# Patient Record
Sex: Male | Born: 1950
Health system: Southern US, Community
[De-identification: ages and names within clinical notes are randomized; demographics above are authoritative.]

## PROBLEM LIST (undated history)

## (undated) DIAGNOSIS — L0231 Cutaneous abscess of buttock: Secondary | ICD-10-CM

## (undated) DIAGNOSIS — I2699 Other pulmonary embolism without acute cor pulmonale: Secondary | ICD-10-CM

## (undated) DIAGNOSIS — K5790 Diverticulosis of intestine, part unspecified, without perforation or abscess without bleeding: Secondary | ICD-10-CM

## (undated) DIAGNOSIS — L03317 Cellulitis of buttock: Secondary | ICD-10-CM

## (undated) DIAGNOSIS — B958 Unspecified staphylococcus as the cause of diseases classified elsewhere: Secondary | ICD-10-CM

## (undated) DIAGNOSIS — D126 Benign neoplasm of colon, unspecified: Secondary | ICD-10-CM

## (undated) DIAGNOSIS — M1990 Unspecified osteoarthritis, unspecified site: Secondary | ICD-10-CM

## (undated) DIAGNOSIS — I82409 Acute embolism and thrombosis of unspecified deep veins of unspecified lower extremity: Secondary | ICD-10-CM

## (undated) HISTORY — DX: Diverticulosis of intestine, part unspecified, without perforation or abscess without bleeding: K57.90

## (undated) HISTORY — DX: Benign neoplasm of colon, unspecified: D12.6

## (undated) HISTORY — DX: Unspecified osteoarthritis, unspecified site: M19.90

## (undated) HISTORY — DX: Other pulmonary embolism without acute cor pulmonale: I26.99

## (undated) HISTORY — DX: Unspecified staphylococcus as the cause of diseases classified elsewhere: B95.8

## (undated) HISTORY — DX: Acute embolism and thrombosis of unspecified deep veins of unspecified lower extremity: I82.409

## (undated) HISTORY — DX: Cellulitis of buttock: L03.317

## (undated) HISTORY — PX: OTHER SURGICAL HISTORY: SHX169

## (undated) HISTORY — PX: AMPUTATION: SHX166

## (undated) HISTORY — DX: Cutaneous abscess of buttock: L02.31

---

## 1997-12-04 ENCOUNTER — Encounter: Payer: Self-pay | Admitting: Emergency Medicine

## 1997-12-04 ENCOUNTER — Inpatient Hospital Stay (HOSPITAL_COMMUNITY): Admission: EM | Admit: 1997-12-04 | Discharge: 1997-12-20 | Payer: Self-pay | Admitting: Emergency Medicine

## 2002-03-22 ENCOUNTER — Emergency Department (HOSPITAL_COMMUNITY): Admission: EM | Admit: 2002-03-22 | Discharge: 2002-03-22 | Payer: Self-pay | Admitting: Emergency Medicine

## 2012-07-25 ENCOUNTER — Encounter (HOSPITAL_COMMUNITY): Payer: Self-pay | Admitting: *Deleted

## 2012-07-25 ENCOUNTER — Emergency Department (HOSPITAL_COMMUNITY): Payer: Medicare Other

## 2012-07-25 ENCOUNTER — Inpatient Hospital Stay (HOSPITAL_COMMUNITY)
Admission: EM | Admit: 2012-07-25 | Discharge: 2012-07-30 | DRG: 571 | Disposition: A | Discharge status: Home / Self Care | Payer: Medicare Other | Attending: Internal Medicine | Admitting: Internal Medicine

## 2012-07-25 DIAGNOSIS — D72829 Elevated white blood cell count, unspecified: Secondary | ICD-10-CM

## 2012-07-25 DIAGNOSIS — R6510 Systemic inflammatory response syndrome (SIRS) of non-infectious origin without acute organ dysfunction: Secondary | ICD-10-CM

## 2012-07-25 DIAGNOSIS — L03317 Cellulitis of buttock: Principal | ICD-10-CM

## 2012-07-25 DIAGNOSIS — R000 Tachycardia, unspecified: Secondary | ICD-10-CM

## 2012-07-25 DIAGNOSIS — E669 Obesity, unspecified: Secondary | ICD-10-CM | POA: Diagnosis present

## 2012-07-25 DIAGNOSIS — L0231 Cutaneous abscess of buttock: Principal | ICD-10-CM

## 2012-07-25 DIAGNOSIS — R509 Fever, unspecified: Secondary | ICD-10-CM

## 2012-07-25 DIAGNOSIS — L0390 Cellulitis, unspecified: Secondary | ICD-10-CM

## 2012-07-25 LAB — POCT I-STAT, CHEM 8
BUN: 10 mg/dL (ref 6–23)
Creatinine, Ser: 1 mg/dL (ref 0.50–1.35)
Glucose, Bld: 125 mg/dL — ABNORMAL HIGH (ref 70–99)
Potassium: 3.7 mEq/L (ref 3.5–5.1)
Sodium: 137 mEq/L (ref 135–145)

## 2012-07-25 LAB — CBC WITH DIFFERENTIAL/PLATELET
Basophils Absolute: 0 10*3/uL (ref 0.0–0.1)
HCT: 48.9 % (ref 39.0–52.0)
Lymphocytes Relative: 13 % (ref 12–46)
Lymphs Abs: 1.9 10*3/uL (ref 0.7–4.0)
MCV: 94.4 fL (ref 78.0–100.0)
Monocytes Absolute: 1.5 10*3/uL — ABNORMAL HIGH (ref 0.1–1.0)
Neutro Abs: 11 10*3/uL — ABNORMAL HIGH (ref 1.7–7.7)
Platelets: 127 10*3/uL — ABNORMAL LOW (ref 150–400)
RBC: 5.18 MIL/uL (ref 4.22–5.81)
RDW: 12 % (ref 11.5–15.5)
WBC: 14.4 10*3/uL — ABNORMAL HIGH (ref 4.0–10.5)

## 2012-07-25 LAB — BASIC METABOLIC PANEL
CO2: 27 mEq/L (ref 19–32)
Chloride: 97 mEq/L (ref 96–112)
Glucose, Bld: 122 mg/dL — ABNORMAL HIGH (ref 70–99)
Sodium: 134 mEq/L — ABNORMAL LOW (ref 135–145)

## 2012-07-25 MED ORDER — SODIUM CHLORIDE 0.9 % IJ SOLN: 3.0000 mL | INTRAMUSCULAR | Status: DC | PRN

## 2012-07-25 MED ORDER — ACETAMINOPHEN 650 MG RE SUPP: 650.0000 mg | Freq: Four times a day (QID) | RECTAL | Status: DC | PRN

## 2012-07-25 MED ORDER — SODIUM CHLORIDE 0.9 % IV SOLN
2000.0000 mg | INTRAVENOUS | Status: AC
  Administered 2012-07-25: 2000 mg via INTRAVENOUS
  Filled 2012-07-25: qty 2000

## 2012-07-25 MED ORDER — MORPHINE SULFATE 4 MG/ML IJ SOLN
4.0000 mg | Freq: Once | INTRAMUSCULAR | Status: AC
  Administered 2012-07-25: 4 mg via INTRAVENOUS
  Filled 2012-07-25: qty 1

## 2012-07-25 MED ORDER — ONDANSETRON HCL 4 MG/2ML IJ SOLN: 4.0000 mg | Freq: Four times a day (QID) | INTRAMUSCULAR | Status: DC | PRN

## 2012-07-25 MED ORDER — SODIUM CHLORIDE 0.9 % IV SOLN
250.0000 mL | INTRAVENOUS | Status: DC | PRN
  Administered 2012-07-28: 12:00:00 via INTRAVENOUS

## 2012-07-25 MED ORDER — HYDROCODONE-ACETAMINOPHEN 5-325 MG PO TABS
1.0000 | ORAL_TABLET | ORAL | Status: DC | PRN
  Administered 2012-07-25 – 2012-07-30 (×18): 2 via ORAL
  Administered 2012-07-30: 1 via ORAL
  Filled 2012-07-25 (×8): qty 2
  Filled 2012-07-25: qty 1
  Filled 2012-07-25 (×10): qty 2

## 2012-07-25 MED ORDER — ACETAMINOPHEN 325 MG PO TABS: 650.0000 mg | ORAL_TABLET | Freq: Four times a day (QID) | ORAL | Status: DC | PRN

## 2012-07-25 MED ORDER — SODIUM CHLORIDE 0.9 % IJ SOLN
3.0000 mL | Freq: Two times a day (BID) | INTRAMUSCULAR | Status: DC
  Administered 2012-07-26 – 2012-07-30 (×7): 3 mL via INTRAVENOUS

## 2012-07-25 MED ORDER — ACETAMINOPHEN 325 MG PO TABS
650.0000 mg | ORAL_TABLET | Freq: Once | ORAL | Status: AC
  Administered 2012-07-25: 650 mg via ORAL
  Filled 2012-07-25: qty 2

## 2012-07-25 MED ORDER — VANCOMYCIN HCL 10 G IV SOLR
1500.0000 mg | Freq: Two times a day (BID) | INTRAVENOUS | Status: DC
  Administered 2012-07-26 – 2012-07-30 (×9): 1500 mg via INTRAVENOUS
  Filled 2012-07-25 (×12): qty 1500

## 2012-07-25 MED ORDER — IOHEXOL 300 MG/ML  SOLN
100.0000 mL | Freq: Once | INTRAMUSCULAR | Status: AC | PRN
  Administered 2012-07-25: 100 mL via INTRAVENOUS

## 2012-07-25 MED ORDER — ONDANSETRON HCL 4 MG PO TABS: 4.0000 mg | ORAL_TABLET | Freq: Four times a day (QID) | ORAL | Status: DC | PRN

## 2012-07-25 NOTE — ED Notes (Signed)
Pt had a syncopal episode while starting IV. Pt and pt wife stated that he hates needles and that he "passes out with the site of needles"

## 2012-07-25 NOTE — H&P (Signed)
PCP:   No PCP Per Patient   Chief Complaint:  Butt pain  HPI: 62 yo male healthy with 2 days of worsening pain to right buttock.  Wife saw a pinhead lesion in the area and "popped" it last night, has some drainage from it last night.  Today is more red and painful.  No recent antibiotics.  No n/v.  No rectal pain.  No fevers at home, but febrile here over 102.  Eating well.    Review of Systems:  Positive and negative as per HPI otherwise all other systems are negative  Past Medical History: History reviewed. No pertinent past medical history. No past surgical history on file.  Medications: Prior to Admission medications   Medication Sig Start Date End Date Taking? Authorizing Provider  aspirin EC 81 MG tablet Take 81 mg by mouth daily.   Yes Historical Provider, MD  vitamin E 1000 UNIT capsule Take 1,000 Units by mouth daily.   Yes Historical Provider, MD    Allergies:  No Known Allergies  Social History: Neg x 3, wife and daughter are present with him in ED  Family History: none  Physical Exam: Filed Vitals:   07/25/12 1931 07/25/12 2000 07/25/12 2101 07/25/12 2200  BP: 145/70 153/79  104/81  Pulse: 101 104  105  Temp: 100 F (37.8 C)  102.7 F (39.3 C)   TempSrc: Oral  Oral   Resp: 18     SpO2: 97% 95%  97%   General appearance: alert, cooperative and no distress nontoxic Head: Normocephalic, without obvious abnormality, atraumatic Eyes: negative Nose: Nares normal. Septum midline. Mucosa normal. No drainage or sinus tenderness. Neck: no JVD and supple, symmetrical, trachea midline Lungs: clear to auscultation bilaterally Heart: regular rate and rhythm, S1, S2 normal, no murmur, click, rub or gallop Abdomen: soft, non-tender; bowel sounds normal; no masses,  no organomegaly Extremities: extremities normal, atraumatic, no cyanosis or edema Pulses: 2+ and symmetric Skin: large area of erythema to right buttock, no drainage, indurated but no flunctuance.  Area  marked out with marker by nursing staff Neurologic: Grossly normal   Labs on Admission:   Recent Labs  07/25/12 1804 07/25/12 1946  NA 134* 137  K 4.0 3.7  CL 97 98  CO2 27  --   GLUCOSE 122* 125*  BUN 10 10  CREATININE 0.81 1.00  CALCIUM 9.1  --     Recent Labs  07/25/12 1804 07/25/12 1946  WBC 14.4*  --   NEUTROABS 11.0*  --   HGB 17.0 17.3*  HCT 48.9 51.0  MCV 94.4  --   PLT 127*  --    Radiological Exams on Admission: Ct Pelvis W Contrast  07/25/2012   *RADIOLOGY REPORT*  Clinical Data:  Right buttock abscess.  CT PELVIS WITH CONTRAST  Technique:  Multidetector CT imaging of the pelvis was performed using the standard protocol following the bolus administration of intravenous contrast.  Contrast: OMNIPAQUE IOHEXOL 300 MG/ML  SOLN  Comparison:   None.  Findings:  Soft tissue stranding in the posterior subcutaneous fat on the right, inferiorly.  No fluid collections are seen on these images.  No soft tissue gas.  Unremarkable bones.  Bilateral inguinal hernias containing fat.  Horseshoe kidney on the first images.  Unremarkable bowel loops.  Coarse prostatic calcifications.  IMPRESSION:  1.  Right posterior subcutaneous edema with no visible fluid collection on these images.  The inferior extent of the edema is not included. 2.  Bilateral  inguinal hernias containing fat. 3.  Horseshoe kidney.   Original Report Authenticated By: Beckie Salts, M.D.    Assessment/Plan  62 yo male with right buttock cellulitis Principal Problem:   Cellulitis and abscess of buttock Active Problems:   Tachycardia   Systemic inflammatory response syndrome (SIRS)  Iv vancomycin.  Pain meds.  Full code.  Meets sirs criteria.  Should improve in next 24 to 48 hours with iv vancomycin.  Aaron Lynch A 07/25/2012, 10:33 PM

## 2012-07-25 NOTE — ED Provider Notes (Signed)
History    CSN: [id number] Arrival date & time 07/25/12  1405  First MD Initiated Contact with Patient 07/25/12 1737     Chief Complaint  Patient presents with  . Abscess   (Consider location/radiation/quality/duration/timing/severity/associated sxs/prior Treatment) HPI Comments: Patient presents with a chief complaint of an abscess of his right buttock.  He reports that the abscess has been present for the past 2 days and is gradually worsening.  Two days ago his wife popped the abscess and expressed purulent fluid.  Yesterday his wife noticed that the area of skin surrounding the abscess became erythematous.  He was seen by Urgent Care just prior to arrival and sent to the ED for further evaluation.  He states that his temperature was 100.2 F at Urgent Care.  However, upon arrival in the ED his temperature was 98.2 and he was not given any medication for fever.  He has not checked his temperature at home.   He denies any nausea or vomiting.   Denies abdominal pain.  Last BM was earlier this morning, which he reports was normal.  No pain with BM.  He reports that he has been urinating normally.  He denies any history of Abscesses.  Denies history of DM.  Patient is a 62 y.o. male presenting with abscess. The history is provided by the patient.  Abscess  History reviewed. No pertinent past medical history. No past surgical history on file. No family history on file. History  Substance Use Topics  . Smoking status: Not on file  . Smokeless tobacco: Not on file  . Alcohol Use: Not on file    Review of Systems  All other systems reviewed and are negative.    Allergies  Review of patient's allergies indicates no known allergies.  Home Medications   Current Outpatient Rx  Name  Route  Sig  Dispense  Refill  . aspirin EC 81 MG tablet   Oral   Take 81 mg by mouth daily.         . vitamin E 1000 UNIT capsule   Oral   Take 1,000 Units by mouth daily.          BP 167/80   Pulse 88  Temp(Src) 99.3 F (37.4 C) (Oral)  Resp 16  SpO2 98% Physical Exam  Nursing note and vitals reviewed. Constitutional: He appears well-developed and well-nourished.  HENT:  Head: Normocephalic and atraumatic.  Neck: Normal range of motion. Neck supple.  Cardiovascular: Normal rate, regular rhythm and normal heart sounds.   Pulmonary/Chest: Effort normal and breath sounds normal.  Abdominal: Soft. Bowel sounds are normal. He exhibits no distension and no mass. There is no tenderness. There is no rebound and no guarding.  Neurological: He is alert.  Skin: Skin is warm and dry.     Psychiatric: He has a normal mood and affect.    ED Course  Procedures (including critical care time) Labs Reviewed - No data to display Ct Pelvis W Contrast  07/25/2012   *RADIOLOGY REPORT*  Clinical Data:  Right buttock abscess.  CT PELVIS WITH CONTRAST  Technique:  Multidetector CT imaging of the pelvis was performed using the standard protocol following the bolus administration of intravenous contrast.  Contrast: OMNIPAQUE IOHEXOL 300 MG/ML  SOLN  Comparison:   None.  Findings:  Soft tissue stranding in the posterior subcutaneous fat on the right, inferiorly.  No fluid collections are seen on these images.  No soft tissue gas.  Unremarkable bones.  Bilateral inguinal hernias containing fat.  Horseshoe kidney on the first images.  Unremarkable bowel loops.  Coarse prostatic calcifications.  IMPRESSION:  1.  Right posterior subcutaneous edema with no visible fluid collection on these images.  The inferior extent of the edema is not included. 2.  Bilateral inguinal hernias containing fat. 3.  Horseshoe kidney.   Original Report Authenticated By: Beckie Salts, M.D.   No diagnosis found.  Patient discussed with and also evaluated by Dr. Manus Gunning.  Discussed with Dr. Onalee Hua with [facility] who has agreed to admit the patient.    MDM  Patient presenting with cellulitis of the right buttock.   No fluctuance palpated.  Therefore, incision and drainage was not performed.  He was afebrile upon arrival in the ED, however, his temperature rose to 102.7 during ED course.  Labs show an elevated WBC of 14.4.  Normal lactate.   Patient started on IV Vancomycin while in the ED.  CT of the pelvis does not show any visible fluid collection.  Patient admitted to [facility] for additional management.  Pascal Lux [place], PA-C 07/26/12 1609

## 2012-07-25 NOTE — Progress Notes (Signed)
ANTIBIOTIC CONSULT NOTE - INITIAL  Pharmacy Consult for vancomycin  Indication: cellulitis/abscess  No Known Allergies  Patient Measurements:   Adjusted Body Weight:   Vital Signs: Temp: 100 F (37.8 C) (07/11 1931) Temp src: Oral (07/11 1931) BP: 145/70 mmHg (07/11 1931) Pulse Rate: 101 (07/11 1931) Intake/Output from previous day:   Intake/Output from this shift:    Labs:  Recent Labs  07/25/12 1946  HGB 17.3*  CREATININE 1.00   CrCl is unknown because there is no height on file for the current visit. No results found for this basename: VANCOTROUGH, VANCOPEAK, VANCORANDOM, GENTTROUGH, GENTPEAK, GENTRANDOM, TOBRATROUGH, TOBRAPEAK, TOBRARND, AMIKACINPEAK, AMIKACINTROU, AMIKACIN,  in the last 72 hours   Microbiology: No results found for this or any previous visit (from the past 720 hour(s)).  Medical History: History reviewed. No pertinent past medical history.  Medications:  Anti-infectives   Start     Dose/Rate Route Frequency Ordered Stop   07/26/12 0900  vancomycin (VANCOCIN) 1,500 mg in sodium chloride 0.9 % 500 mL IVPB     1,500 mg 250 mL/hr over 120 Minutes Intravenous Every 12 hours 07/25/12 1959     07/25/12 1830  vancomycin (VANCOCIN) 2,000 mg in sodium chloride 0.9 % 500 mL IVPB     2,000 mg 250 mL/hr over 120 Minutes Intravenous To Emergency Dept 07/25/12 1811 07/26/12 1830     Assessment: 62 yom presented to the ED c/o abscess since Wednesday. To start empiric vancomycin for cellulitis/abscess. Tmax is 100 and WBC is 14.4, Scr is WNL at 1. Dosing will be based on patients stated height/weight of 74in/318lbs.   Goal of Therapy:  Vancomycin trough level 10-15 mcg/ml  Plan:  1. Vancomycin 2gm IV x 1 then 1500mg  IV Q12H 2. F/u renal fxn, C&S, clinical status and trough at [facility], [name] 07/25/2012,7:59 PM

## 2012-07-25 NOTE — ED Notes (Addendum)
Pt in c/o abscess to right buttock since Wednesday, some drainage, pt states he went to urgent care and he was told the abscess was too close to his rectal area for them to I and D.

## 2012-07-25 NOTE — ED Notes (Signed)
Patient transported to CT 

## 2012-07-26 DIAGNOSIS — L0291 Cutaneous abscess, unspecified: Secondary | ICD-10-CM

## 2012-07-26 DIAGNOSIS — D72829 Elevated white blood cell count, unspecified: Secondary | ICD-10-CM

## 2012-07-26 LAB — BASIC METABOLIC PANEL
Calcium: 8.5 mg/dL (ref 8.4–10.5)
GFR calc non Af Amer: 88 mL/min — ABNORMAL LOW (ref >90)
Sodium: 133 mEq/L — ABNORMAL LOW (ref 135–145)

## 2012-07-26 LAB — CBC
MCH: 33.1 pg (ref 26.0–34.0)
RBC: 4.9 MIL/uL (ref 4.22–5.81)
WBC: 13.9 10*3/uL — ABNORMAL HIGH (ref 4.0–10.5)

## 2012-07-26 NOTE — ED Provider Notes (Signed)
Medical screening examination/treatment/procedure(s) were conducted as a shared visit with non-physician practitioner(s) and myself.  I personally evaluated the patient during the encounter  Pain, redness, swelling to R buttock x 2 days.  Associated with fever.   Large area of cellulitis with central induration. No fluid collection seen on imaging.  Glynn Octave, MD 07/26/12 [phone]

## 2012-07-26 NOTE — Progress Notes (Signed)
UR Completed.  Aaron Lynch [phone] 07/26/2012  

## 2012-07-26 NOTE — Progress Notes (Signed)
[facility] PROGRESS NOTE  Aaron Lynch ZOX:[id number] DOB: [birth year] DOA: 07/25/2012 PCP: No PCP Per Patient  Assessment/Plan: 1. Cellulitis of buttock- warm compresses, culture wound, IV abx- wound area is marked 2. Obesity 3. Leukocytosis- decreasing  Code Status: full Family Communication: patient/wife Disposition Plan: abx   Consultants:  none  Procedures:    Antibiotics:  vanc  HPI/Subjective: Buttocks still hurting  Objective: Filed Vitals:   07/25/12 2300 07/25/12 2311 07/25/12 2344 07/26/12 0545  BP: 114/52  117/47 95/47  Pulse: 96  94 91  Temp:  100.6 F (38.1 C) 101 F (38.3 C) 100.7 F (38.2 C)  TempSrc:  Oral Oral Oral  Resp:   16 17  Height:   6\' 2"  (1.88 m)   Weight:   142.475 kg (314 lb 1.6 oz)   SpO2: 96%  96% 93%    Intake/Output Summary (Last 24 hours) at 07/26/12 1230 Last data filed at 07/25/12 2243  Gross per 24 hour  Intake      0 ml  Output    500 ml  Net   -500 ml   Filed Weights   07/25/12 2244 07/25/12 2344  Weight: 144.244 kg (318 lb) 142.475 kg (314 lb 1.6 oz)    Exam:   General:  A+Ox3, NAD  Cardiovascular: rrr  Respiratory: clear anterior  Abdomen: +BS, soft  Musculoskeletal: moves all 4 ext   Data Reviewed: Basic Metabolic Panel:  Recent Labs Lab 07/25/12 1804 07/25/12 1946 07/26/12 0410  NA 134* 137 133*  K 4.0 3.7 4.0  CL 97 98 96  CO2 27  --  30  GLUCOSE 122* 125* 133*  BUN 10 10 10   CREATININE 0.81 1.00 0.93  CALCIUM 9.1  --  8.5   Liver Function Tests: No results found for this basename: AST, ALT, ALKPHOS, BILITOT, PROT, ALBUMIN,  in the last 168 hours No results found for this basename: LIPASE, AMYLASE,  in the last 168 hours No results found for this basename: AMMONIA,  in the last 168 hours CBC:  Recent Labs Lab 07/25/12 1804 07/25/12 1946 07/26/12 0410  WBC 14.4*  --  13.9*  NEUTROABS 11.0*  --   --   HGB 17.0 17.3* 16.2  HCT 48.9 51.0 46.6  MCV 94.4  --  95.1  PLT  127*  --  120*   Cardiac Enzymes: No results found for this basename: CKTOTAL, CKMB, CKMBINDEX, TROPONINI,  in the last 168 hours BNP (last 3 results) No results found for this basename: PROBNP,  in the last 8760 hours CBG: No results found for this basename: GLUCAP,  in the last 168 hours  No results found for this or any previous visit (from the past 240 hour(s)).   Studies: Ct Pelvis W Contrast  07/25/2012   *RADIOLOGY REPORT*  Clinical Data:  Right buttock abscess.  CT PELVIS WITH CONTRAST  Technique:  Multidetector CT imaging of the pelvis was performed using the standard protocol following the bolus administration of intravenous contrast.  Contrast: OMNIPAQUE IOHEXOL 300 MG/ML  SOLN  Comparison:   None.  Findings:  Soft tissue stranding in the posterior subcutaneous fat on the right, inferiorly.  No fluid collections are seen on these images.  No soft tissue gas.  Unremarkable bones.  Bilateral inguinal hernias containing fat.  Horseshoe kidney on the first images.  Unremarkable bowel loops.  Coarse prostatic calcifications.  IMPRESSION:  1.  Right posterior subcutaneous edema with no visible fluid collection on these images.  The inferior extent of the edema is not included. 2.  Bilateral inguinal hernias containing fat. 3.  Horseshoe kidney.   Original Report Authenticated By: Beckie Salts, M.D.    Scheduled Meds: . sodium chloride  3 mL Intravenous Q12H  . vancomycin  1,500 mg Intravenous Q12H   Continuous Infusions:   Principal Problem:   Cellulitis and abscess of buttock Active Problems:   Tachycardia   Systemic inflammatory response syndrome (SIRS)    Time spent: 35    [facility], Karandeep Resende  [facility] Pager [phone]. If 7PM-7AM, please contact night-coverage at [email or web address], password [facility] 07/26/2012, 12:30 PM  LOS: 1 day

## 2012-07-27 DIAGNOSIS — L0231 Cutaneous abscess of buttock: Secondary | ICD-10-CM

## 2012-07-27 DIAGNOSIS — L03317 Cellulitis of buttock: Secondary | ICD-10-CM

## 2012-07-27 LAB — BASIC METABOLIC PANEL
CO2: 25 mEq/L (ref 19–32)
Calcium: 8.6 mg/dL (ref 8.4–10.5)
Creatinine, Ser: 0.91 mg/dL (ref 0.50–1.35)
GFR calc non Af Amer: 89 mL/min — ABNORMAL LOW (ref >90)
Glucose, Bld: 111 mg/dL — ABNORMAL HIGH (ref 70–99)
Sodium: 134 mEq/L — ABNORMAL LOW (ref 135–145)

## 2012-07-27 LAB — CBC
MCH: 32.9 pg (ref 26.0–34.0)
MCHC: 34.3 g/dL (ref 30.0–36.0)
MCV: 95.7 fL (ref 78.0–100.0)
Platelets: 126 10*3/uL — ABNORMAL LOW (ref 150–400)
RBC: 4.84 MIL/uL (ref 4.22–5.81)

## 2012-07-27 MED ORDER — DEXTROSE-NACL 5-0.9 % IV SOLN
INTRAVENOUS | Status: DC
  Administered 2012-07-27: 100 mL/h via INTRAVENOUS
  Administered 2012-07-28: 09:00:00 via INTRAVENOUS

## 2012-07-27 NOTE — Consult Note (Signed)
Reason for Consult:Abscesses to right gluteal region Referring Physician: Dr. Vann  Aaron Lynch is an 62 y.o. male.  HPI: the patient is a 60-year-old male who is admitted to the hospital approximately 2 days ago for a right gluteal salines. The patient was placed on antibiotics. The area to the patient's gluteus was become more erythematous-looking be an area of fluctuance. Surgical consult obtained.  History reviewed. No pertinent past medical history.  No past surgical history on file.  No family history on file.  Social History:  has no tobacco, alcohol, and drug history on file.  Allergies: No Known Allergies  Medications: I have reviewed the patient's current medications.  Results for orders placed during the hospital encounter of 07/25/12 (from the past 48 hour(s))  CBC WITH DIFFERENTIAL     Status: Abnormal   Collection Time    07/25/12  6:04 PM      Result Value Range   WBC 14.4 (*) 4.0 - 10.5 K/uL   RBC 5.18  4.22 - 5.81 MIL/uL   Hemoglobin 17.0  13.0 - 17.0 g/dL   HCT 48.9  39.0 - 52.0 %   MCV 94.4  78.0 - 100.0 fL   MCH 32.8  26.0 - 34.0 pg   MCHC 34.8  30.0 - 36.0 g/dL   RDW 12.0  11.5 - 15.5 %   Platelets 127 (*) 150 - 400 K/uL   Neutrophils Relative % 76  43 - 77 %   Neutro Abs 11.0 (*) 1.7 - 7.7 K/uL   Lymphocytes Relative 13  12 - 46 %   Lymphs Abs 1.9  0.7 - 4.0 K/uL   Monocytes Relative 10  3 - 12 %   Monocytes Absolute 1.5 (*) 0.1 - 1.0 K/uL   Eosinophils Relative 0  0 - 5 %   Eosinophils Absolute 0.1  0.0 - 0.7 K/uL   Basophils Relative 0  0 - 1 %   Basophils Absolute 0.0  0.0 - 0.1 K/uL  BASIC METABOLIC PANEL     Status: Abnormal   Collection Time    07/25/12  6:04 PM      Result Value Range   Sodium 134 (*) 135 - 145 mEq/L   Potassium 4.0  3.5 - 5.1 mEq/L   Chloride 97  96 - 112 mEq/L   CO2 27  19 - 32 mEq/L   Glucose, Bld 122 (*) 70 - 99 mg/dL   BUN 10  6 - 23 mg/dL   Creatinine, Ser 0.81  0.50 - 1.35 mg/dL   Calcium 9.1  8.4 - 10.5  mg/dL   GFR calc non Af Amer >90  >90 mL/min   GFR calc Af Amer >90  >90 mL/min   Comment:            The eGFR has been calculated     using the CKD EPI equation.     This calculation has not been     validated in all clinical     situations.     eGFR's persistently     <90 mL/min signify     possible Chronic Kidney Disease.  POCT I-STAT, CHEM 8     Status: Abnormal   Collection Time    07/25/12  7:46 PM      Result Value Range   Sodium 137  135 - 145 mEq/L   Potassium 3.7  3.5 - 5.1 mEq/L   Chloride 98  96 - 112 mEq/L   BUN 10    6 - 23 mg/dL   Creatinine, Ser 1.00  0.50 - 1.35 mg/dL   Glucose, Bld 125 (*) 70 - 99 mg/dL   Calcium, Ion 1.13  1.13 - 1.30 mmol/L   TCO2 26  0 - 100 mmol/L   Hemoglobin 17.3 (*) 13.0 - 17.0 g/dL   HCT 51.0  39.0 - 52.0 %  CG4 I-STAT (LACTIC ACID)     Status: None   Collection Time    07/25/12 10:52 PM      Result Value Range   Lactic Acid, Venous 1.30  0.5 - 2.2 mmol/L  BASIC METABOLIC PANEL     Status: Abnormal   Collection Time    07/26/12  4:10 AM      Result Value Range   Sodium 133 (*) 135 - 145 mEq/L   Potassium 4.0  3.5 - 5.1 mEq/L   Chloride 96  96 - 112 mEq/L   CO2 30  19 - 32 mEq/L   Glucose, Bld 133 (*) 70 - 99 mg/dL   BUN 10  6 - 23 mg/dL   Creatinine, Ser 0.93  0.50 - 1.35 mg/dL   Calcium 8.5  8.4 - 10.5 mg/dL   GFR calc non Af Amer 88 (*) >90 mL/min   GFR calc Af Amer >90  >90 mL/min   Comment:            The eGFR has been calculated     using the CKD EPI equation.     This calculation has not been     validated in all clinical     situations.     eGFR's persistently     <90 mL/min signify     possible Chronic Kidney Disease.  CBC     Status: Abnormal   Collection Time    07/26/12  4:10 AM      Result Value Range   WBC 13.9 (*) 4.0 - 10.5 K/uL   RBC 4.90  4.22 - 5.81 MIL/uL   Hemoglobin 16.2  13.0 - 17.0 g/dL   HCT 46.6  39.0 - 52.0 %   MCV 95.1  78.0 - 100.0 fL   MCH 33.1  26.0 - 34.0 pg   MCHC 34.8  30.0 - 36.0  g/dL   RDW 12.1  11.5 - 15.5 %   Platelets 120 (*) 150 - 400 K/uL  CULTURE, ROUTINE-ABSCESS     Status: None   Collection Time    07/26/12 12:30 PM      Result Value Range   Specimen Description ABSCESS     Special Requests RIGHT BUTTOCKS     Gram Stain       Value: RARE WBC PRESENT, PREDOMINANTLY PMN     NO SQUAMOUS EPITHELIAL CELLS SEEN     FEW GRAM POSITIVE COCCI IN PAIRS   Culture Culture reincubated for better growth     Report Status PENDING    CBC     Status: Abnormal   Collection Time    07/27/12  5:00 AM      Result Value Range   WBC 12.7 (*) 4.0 - 10.5 K/uL   RBC 4.84  4.22 - 5.81 MIL/uL   Hemoglobin 15.9  13.0 - 17.0 g/dL   HCT 46.3  39.0 - 52.0 %   MCV 95.7  78.0 - 100.0 fL   MCH 32.9  26.0 - 34.0 pg   MCHC 34.3  30.0 - 36.0 g/dL   RDW 12.1  11.5 - 15.5 %     Platelets 126 (*) 150 - 400 K/uL  BASIC METABOLIC PANEL     Status: Abnormal   Collection Time    07/27/12  5:00 AM      Result Value Range   Sodium 134 (*) 135 - 145 mEq/L   Potassium 3.7  3.5 - 5.1 mEq/L   Chloride 98  96 - 112 mEq/L   CO2 25  19 - 32 mEq/L   Glucose, Bld 111 (*) 70 - 99 mg/dL   BUN 10  6 - 23 mg/dL   Creatinine, Ser 0.91  0.50 - 1.35 mg/dL   Calcium 8.6  8.4 - 10.5 mg/dL   GFR calc non Af Amer 89 (*) >90 mL/min   GFR calc Af Amer >90  >90 mL/min   Comment:            The eGFR has been calculated     using the CKD EPI equation.     This calculation has not been     validated in all clinical     situations.     eGFR's persistently     <90 mL/min signify     possible Chronic Kidney Disease.    Ct Pelvis W Contrast  07/25/2012   *RADIOLOGY REPORT*  Clinical Data:  Right buttock abscess.  CT PELVIS WITH CONTRAST  Technique:  Multidetector CT imaging of the pelvis was performed using the standard protocol following the bolus administration of intravenous contrast.  Contrast: 100mL OMNIPAQUE IOHEXOL 300 MG/ML  SOLN  Comparison:   None.  Findings:  Soft tissue stranding in the posterior  subcutaneous fat on the right, inferiorly.  No fluid collections are seen on these images.  No soft tissue gas.  Unremarkable bones.  Bilateral inguinal hernias containing fat.  Horseshoe kidney on the first images.  Unremarkable bowel loops.  Coarse prostatic calcifications.  IMPRESSION:  1.  Right posterior subcutaneous edema with no visible fluid collection on these images.  The inferior extent of the edema is not included. 2.  Bilateral inguinal hernias containing fat. 3.  Horseshoe kidney.   Original Report Authenticated By: Steven Reid, M.D.    Review of Systems  Unable to perform ROS Constitutional: Negative.   HENT: Negative.   Eyes: Negative.   Respiratory: Negative.   Cardiovascular: Negative.   Gastrointestinal: Negative.   Genitourinary: Negative.   Musculoskeletal: Negative.   Skin: Negative.   Neurological: Negative.    Blood pressure 126/64, pulse 90, temperature 98.6 F (37 C), temperature source Oral, resp. rate 18, height 6' 2" (1.88 m), weight 314 lb 1.6 oz (142.475 kg), SpO2 96.00%. Physical Exam  Constitutional: He is oriented to person, place, and time. He appears well-developed and well-nourished.  HENT:  Head: Normocephalic and atraumatic.  Eyes: Conjunctivae and EOM are normal. Pupils are equal, round, and reactive to light.  Neck: Normal range of motion. Neck supple.  Cardiovascular: Normal rate, regular rhythm and normal heart sounds.   Respiratory: Effort normal.  GI: Bowel sounds are normal.  Genitourinary:     Neurological: He is alert and oriented to person, place, and time.  Skin: Skin is warm and dry.    Assessment/Plan: 62-year-old male with a right gluteal abscess. 1. Will make patient n.p.o. At this time and the patient consented for a I&D of her right gluteal abscess. 2. We'll schedule patient for a I&D of gluteal abscess in the a.m.   Kaien Pezzullo Jr., Diamantina Edinger 07/27/2012, 5:38 PM      

## 2012-07-27 NOTE — Progress Notes (Signed)
[facility] PROGRESS NOTE  Aaron Lynch ZOX:[id number] DOB: [birth year] DOA: 07/25/2012 PCP: No PCP Per Patient  Assessment/Plan: 1. Cellulitis of buttock- warm compresses,  Wound culture pending, IV abx- wound area is marked- has gone outside of area, firm area palpated- CT scan done but does not appear to go down far enough; consulted surgery 2. Obesity 3. Leukocytosis- decreasing  Code Status: full Family Communication: patient/wife Disposition Plan: abx   Consultants:  none  Procedures:    Antibiotics:  vanc  HPI/Subjective: Up getting shower Pain worsened around butocks  Objective: Filed Vitals:   07/26/12 1452 07/26/12 1548 07/26/12 2322 07/27/12 0537  BP: 107/55 108/56 96/47 103/47  Pulse: 78 82 73 82  Temp: 100.2 F (37.9 C) 98.9 F (37.2 C) 99.6 F (37.6 C) 99.4 F (37.4 C)  TempSrc: Oral Oral Oral Oral  Resp: 18 18 17 18   Height:      Weight:      SpO2: 95% 96% 92% 95%    Intake/Output Summary (Last 24 hours) at 07/27/12 1014 Last data filed at 07/27/12 0200  Gross per 24 hour  Intake   1240 ml  Output      0 ml  Net   1240 ml   Filed Weights   07/25/12 2244 07/25/12 2344  Weight: 144.244 kg (318 lb) 142.475 kg (314 lb 1.6 oz)    Exam:   General:  A+Ox3, NAD  Cardiovascular: rrr  Respiratory: clear anterior  Abdomen: +BS, soft  Musculoskeletal: moves all 4 ext   Data Reviewed: Basic Metabolic Panel:  Recent Labs Lab 07/25/12 1804 07/25/12 1946 07/26/12 0410 07/27/12 0500  NA 134* 137 133* 134*  K 4.0 3.7 4.0 3.7  CL 97 98 96 98  CO2 27  --  30 25  GLUCOSE 122* 125* 133* 111*  BUN 10 10 10 10   CREATININE 0.81 1.00 0.93 0.91  CALCIUM 9.1  --  8.5 8.6   Liver Function Tests: No results found for this basename: AST, ALT, ALKPHOS, BILITOT, PROT, ALBUMIN,  in the last 168 hours No results found for this basename: LIPASE, AMYLASE,  in the last 168 hours No results found for this basename: AMMONIA,  in the last 168  hours CBC:  Recent Labs Lab 07/25/12 1804 07/25/12 1946 07/26/12 0410 07/27/12 0500  WBC 14.4*  --  13.9* 12.7*  NEUTROABS 11.0*  --   --   --   HGB 17.0 17.3* 16.2 15.9  HCT 48.9 51.0 46.6 46.3  MCV 94.4  --  95.1 95.7  PLT 127*  --  120* 126*   Cardiac Enzymes: No results found for this basename: CKTOTAL, CKMB, CKMBINDEX, TROPONINI,  in the last 168 hours BNP (last 3 results) No results found for this basename: PROBNP,  in the last 8760 hours CBG: No results found for this basename: GLUCAP,  in the last 168 hours  Recent Results (from the past 240 hour(s))  CULTURE, ROUTINE-ABSCESS     Status: None   Collection Time    07/26/12 12:30 PM      Result Value Range Status   Specimen Description ABSCESS   Final   Special Requests RIGHT BUTTOCKS   Final   Gram Stain PENDING   Incomplete   Culture Culture reincubated for better growth   Final   Report Status PENDING   Incomplete     Studies: Ct Pelvis W Contrast  07/25/2012   *RADIOLOGY REPORT*  Clinical Data:  Right buttock abscess.  CT PELVIS  WITH CONTRAST  Technique:  Multidetector CT imaging of the pelvis was performed using the standard protocol following the bolus administration of intravenous contrast.  Contrast: OMNIPAQUE IOHEXOL 300 MG/ML  SOLN  Comparison:   None.  Findings:  Soft tissue stranding in the posterior subcutaneous fat on the right, inferiorly.  No fluid collections are seen on these images.  No soft tissue gas.  Unremarkable bones.  Bilateral inguinal hernias containing fat.  Horseshoe kidney on the first images.  Unremarkable bowel loops.  Coarse prostatic calcifications.  IMPRESSION:  1.  Right posterior subcutaneous edema with no visible fluid collection on these images.  The inferior extent of the edema is not included. 2.  Bilateral inguinal hernias containing fat. 3.  Horseshoe kidney.   Original Report Authenticated By: Beckie Salts, M.D.    Scheduled Meds: . sodium chloride  3 mL Intravenous Q12H   . vancomycin  1,500 mg Intravenous Q12H   Continuous Infusions:   Principal Problem:   Cellulitis and abscess of buttock Active Problems:   Tachycardia   Systemic inflammatory response syndrome (SIRS)    Time spent: 25    Marlin Canary  [facility] Pager [phone]. If 7PM-7AM, please contact night-coverage at [email or web address], password [facility] 07/27/2012, 10:14 AM  LOS: 2 days

## 2012-07-28 ENCOUNTER — Encounter (HOSPITAL_COMMUNITY)
Admission: EM | Disposition: A | Discharge status: Home / Self Care | Payer: Self-pay | Source: Home / Self Care | Attending: Internal Medicine

## 2012-07-28 ENCOUNTER — Inpatient Hospital Stay (HOSPITAL_COMMUNITY): Payer: Medicare Other | Admitting: Anesthesiology

## 2012-07-28 ENCOUNTER — Encounter (HOSPITAL_COMMUNITY): Payer: Self-pay | Admitting: Anesthesiology

## 2012-07-28 ENCOUNTER — Encounter (HOSPITAL_COMMUNITY): Payer: Self-pay | Admitting: *Deleted

## 2012-07-28 DIAGNOSIS — R509 Fever, unspecified: Secondary | ICD-10-CM

## 2012-07-28 HISTORY — PX: INCISION AND DRAINAGE PERIRECTAL ABSCESS: SHX1804

## 2012-07-28 LAB — SURGICAL PCR SCREEN
MRSA, PCR: NEGATIVE
Staphylococcus aureus: NEGATIVE

## 2012-07-28 SURGERY — INCISION AND DRAINAGE, ABSCESS, PERIRECTAL
Anesthesia: General | Site: Buttocks | Wound class: Dirty or Infected

## 2012-07-28 MED ORDER — MORPHINE SULFATE 4 MG/ML IJ SOLN: 4.0000 mg | INTRAMUSCULAR | Status: DC | PRN

## 2012-07-28 MED ORDER — LIDOCAINE HCL (CARDIAC) 20 MG/ML IV SOLN
INTRAVENOUS | Status: DC | PRN
  Administered 2012-07-28: 100 mg via INTRAVENOUS

## 2012-07-28 MED ORDER — ONDANSETRON HCL 4 MG/2ML IJ SOLN: 4.0000 mg | Freq: Once | INTRAMUSCULAR | Status: AC | PRN

## 2012-07-28 MED ORDER — PROPOFOL 10 MG/ML IV BOLUS
INTRAVENOUS | Status: DC | PRN
  Administered 2012-07-28: 300 mg via INTRAVENOUS

## 2012-07-28 MED ORDER — FENTANYL CITRATE 0.05 MG/ML IJ SOLN
INTRAMUSCULAR | Status: DC | PRN
  Administered 2012-07-28: 50 ug via INTRAVENOUS
  Administered 2012-07-28 (×2): 100 ug via INTRAVENOUS

## 2012-07-28 MED ORDER — LACTATED RINGERS IV SOLN
INTRAVENOUS | Status: DC | PRN
  Administered 2012-07-28: 12:00:00 via INTRAVENOUS

## 2012-07-28 MED ORDER — HYDROMORPHONE HCL PF 1 MG/ML IJ SOLN
0.2500 mg | INTRAMUSCULAR | Status: DC | PRN
  Administered 2012-07-28 (×2): 0.5 mg via INTRAVENOUS

## 2012-07-28 MED ORDER — HYDROMORPHONE HCL PF 1 MG/ML IJ SOLN
INTRAMUSCULAR | Status: AC
  Administered 2012-07-28: 0.5 mg via INTRAVENOUS
  Filled 2012-07-28: qty 1

## 2012-07-28 MED ORDER — ONDANSETRON HCL 4 MG/2ML IJ SOLN
INTRAMUSCULAR | Status: DC | PRN
  Administered 2012-07-28: 4 mg via INTRAVENOUS

## 2012-07-28 MED ORDER — 0.9 % SODIUM CHLORIDE (POUR BTL) OPTIME
TOPICAL | Status: DC | PRN
  Administered 2012-07-28: 1000 mL

## 2012-07-28 SURGICAL SUPPLY — 35 items
BANDAGE GAUZE ELAST BULKY 4 IN (GAUZE/BANDAGES/DRESSINGS) ×2 IMPLANT
BLADE SURG 10 STRL SS (BLADE) ×2 IMPLANT
BLADE SURG 15 STRL LF DISP TIS (BLADE) ×2 IMPLANT
BLADE SURG 15 STRL SS (BLADE) ×2
CANISTER SUCTION 2500CC (MISCELLANEOUS) ×2 IMPLANT
CLEANER TIP ELECTROSURG 2X2 (MISCELLANEOUS) IMPLANT
CLOTH BEACON ORANGE TIMEOUT ST (SAFETY) ×2 IMPLANT
COVER SURGICAL LIGHT HANDLE (MISCELLANEOUS) ×2 IMPLANT
DRAPE UTILITY 15X26 W/TAPE STR (DRAPE) ×4 IMPLANT
DRSG PAD ABDOMINAL 8X10 ST (GAUZE/BANDAGES/DRESSINGS) ×2 IMPLANT
ELECT CAUTERY BLADE 6.4 (BLADE) ×2 IMPLANT
ELECT REM PT RETURN 9FT ADLT (ELECTROSURGICAL)
ELECTRODE REM PT RTRN 9FT ADLT (ELECTROSURGICAL) IMPLANT
GAUZE PACKING IODOFORM 1 (PACKING) IMPLANT
GAUZE SPONGE 4X4 16PLY XRAY LF (GAUZE/BANDAGES/DRESSINGS) ×2 IMPLANT
GLOVE BIO SURGEON STRL SZ7.5 (GLOVE) ×4 IMPLANT
GLOVE BIOGEL PI IND STRL 7.5 (GLOVE) ×2 IMPLANT
GLOVE BIOGEL PI INDICATOR 7.5 (GLOVE) ×2
GOWN STRL NON-REIN LRG LVL3 (GOWN DISPOSABLE) ×6 IMPLANT
KIT BASIN OR (CUSTOM PROCEDURE TRAY) ×2 IMPLANT
KIT ROOM TURNOVER OR (KITS) ×2 IMPLANT
NS IRRIG 1000ML POUR BTL (IV SOLUTION) ×2 IMPLANT
PACK LITHOTOMY IV (CUSTOM PROCEDURE TRAY) ×2 IMPLANT
PAD ARMBOARD 7.5X6 YLW CONV (MISCELLANEOUS) ×4 IMPLANT
PENCIL BUTTON HOLSTER BLD 10FT (ELECTRODE) IMPLANT
SPONGE GAUZE 4X4 12PLY (GAUZE/BANDAGES/DRESSINGS) ×2 IMPLANT
SWAB COLLECTION DEVICE MRSA (MISCELLANEOUS) ×2 IMPLANT
TAPE CLOTH SURG 6X10 WHT LF (GAUZE/BANDAGES/DRESSINGS) ×2 IMPLANT
TOWEL OR 17X24 6PK STRL BLUE (TOWEL DISPOSABLE) ×2 IMPLANT
TOWEL OR 17X26 10 PK STRL BLUE (TOWEL DISPOSABLE) ×2 IMPLANT
TUBE ANAEROBIC SPECIMEN COL (MISCELLANEOUS) ×2 IMPLANT
TUBE CONNECTING 12X1/4 (SUCTIONS) ×2 IMPLANT
UNDERPAD 30X30 INCONTINENT (UNDERPADS AND DIAPERS) ×2 IMPLANT
WATER STERILE IRR 1000ML POUR (IV SOLUTION) ×2 IMPLANT
YANKAUER SUCT BULB TIP NO VENT (SUCTIONS) ×2 IMPLANT

## 2012-07-28 NOTE — Progress Notes (Signed)
ANTIBIOTIC CONSULT NOTE - FOLLOW UP  Pharmacy Consult for Vancomycin Indication: cellulitis, buttock abscess  No Known Allergies  Patient Measurements: Height: 6\' 2"  (188 cm) Weight: 314 lb 1.6 oz (142.475 kg) IBW/kg (Calculated) : 82.2  Vital Signs: Temp: 98.7 F (37.1 C) (07/14 1240) Temp src: Oral (07/14 1105) BP: 130/77 mmHg (07/14 1330) Pulse Rate: 91 (07/14 1330) Intake/Output from previous day: 07/13 0701 - 07/14 0700 In: 560 [P.O.:560] Out: -  Intake/Output from this shift: Total I/O In: 800 [I.V.:800] Out: -   Labs:  Recent Labs  07/25/12 1804 07/25/12 1946 07/26/12 0410 07/27/12 0500  WBC 14.4*  --  13.9* 12.7*  HGB 17.0 17.3* 16.2 15.9  PLT 127*  --  120* 126*  CREATININE 0.81 1.00 0.93 0.91   Estimated Creatinine Clearance: 126.5 ml/min (by C-G formula based on Cr of 0.91).  Recent Labs  07/28/12 0750  VANCOTROUGH 10.7     Microbiology: Recent Results (from the past 720 hour(s))  CULTURE, ROUTINE-ABSCESS     Status: None   Collection Time    07/26/12 12:30 PM      Result Value Range Status   Specimen Description ABSCESS   Final   Special Requests RIGHT BUTTOCKS   Final   Gram Stain     Final   Value: RARE WBC PRESENT, PREDOMINANTLY PMN     NO SQUAMOUS EPITHELIAL CELLS SEEN     FEW GRAM POSITIVE COCCI IN PAIRS   Culture     Final   Value: FEW STAPHYLOCOCCUS AUREUS     Note: RIFAMPIN AND GENTAMICIN SHOULD NOT BE USED AS SINGLE DRUGS FOR TREATMENT OF STAPH INFECTIONS.   Report Status PENDING   Incomplete  SURGICAL PCR SCREEN     Status: None   Collection Time    07/28/12  5:12 AM      Result Value Range Status   MRSA, PCR NEGATIVE  NEGATIVE Final   Staphylococcus aureus NEGATIVE  NEGATIVE Final   Comment:            The Xpert SA Assay (FDA     approved for NASAL specimens     in patients over 96 years of age),     is one component of     a comprehensive surveillance     program.  Test performance has     been validated by [employer] for patients greater     than or equal to 52 year old.     It is not intended     to diagnose infection nor to     guide or monitor treatment.    Anti-infectives   Start     Dose/Rate Route Frequency Ordered Stop   07/26/12 0900  vancomycin (VANCOCIN) 1,500 mg in sodium chloride 0.9 % 500 mL IVPB     1,500 mg 250 mL/hr over 120 Minutes Intravenous Every 12 hours 07/25/12 1959     07/25/12 1830  vancomycin (VANCOCIN) 2,000 mg in sodium chloride 0.9 % 500 mL IVPB     2,000 mg 250 mL/hr over 120 Minutes Intravenous To Emergency Dept 07/25/12 1811 07/25/12 2310      Assessment: 62 yo M admitted 07/25/12 with worsening R buttock pain 2/2 abscess/cellulitis.  Pt was taken to the OR today for surgical I&D of abscess.  Abscess cultures were sent as well.  Pt is on day # 4 IV antibiotics.  Vancomycin trough this AM was therapeutic at 10.7.  Goal of Therapy:  Vancomycin trough level 10-15 mcg/ml  Plan:  Continue Vancomycin 1500 mg IV q12h. Follow-up cx data, renal function, and clinical progress.  [employer], Pharm.D., BCPS Clinical Pharmacist Pager [phone] 07/28/2012 2:17 PM

## 2012-07-28 NOTE — Transfer of Care (Signed)
Immediate Anesthesia Transfer of Care Note  Patient: Aaron Lynch  Procedure(s) Performed: Procedure(s): IRRIGATION AND DEBRIDEMENT GLUTEAL ABCESS (N/A)  Patient Location: PACU  Anesthesia Type:General  Level of Consciousness: awake, alert , oriented and patient cooperative  Airway & Oxygen Therapy: Patient Spontanous Breathing and Patient connected to nasal cannula oxygen  Post-op Assessment: Report given to PACU RN and Post -op Vital signs reviewed and stable  Post vital signs: Reviewed and stable  Complications: No apparent anesthesia complications

## 2012-07-28 NOTE — Progress Notes (Signed)
Patient ambulated with wife, all the way around the unit 2 times this afternoon after surgery. Patient not complaining of pain during ambulation.

## 2012-07-28 NOTE — Preoperative (Signed)
Beta Blockers   Reason not to administer Beta Blockers:Not Applicable 

## 2012-07-28 NOTE — Interval H&P Note (Signed)
History and Physical Interval Note:  07/28/2012 10:55 AM  Aaron Lynch  has presented today for surgery, with the diagnosis of Gluteal Abscess  The various methods of treatment have been discussed with the patient and family. After consideration of risks, benefits and other options for treatment, the patient has consented to  Procedure(s): IRRIGATION AND DEBRIDEMENT Gluteal Abscess (N/A) as a surgical intervention .  The patient's history has been reviewed, patient examined, no change in status, stable for surgery.  I have reviewed the patient's chart and labs.  Questions were answered to the patient's satisfaction.     TOTH III,PAUL S

## 2012-07-28 NOTE — Op Note (Signed)
07/25/2012 - 07/28/2012  12:28 PM  PATIENT:  Aaron Lynch  62 y.o. male  PRE-OPERATIVE DIAGNOSIS:  Gluteal Abscess  POST-OPERATIVE DIAGNOSIS:  Gluteal Abscess  PROCEDURE:  Procedure(s): IRRIGATION AND DEBRIDEMENT GLUTEAL ABCESS (N/A)  SURGEON:  Surgeon(s) and Role:    * Robyne Askew, MD - Primary  PHYSICIAN ASSISTANT:   ASSISTANTS: none   ANESTHESIA:   general  EBL:     BLOOD ADMINISTERED:none  DRAINS: none   LOCAL MEDICATIONS USED:  NONE  SPECIMEN:  No Specimen  DISPOSITION OF SPECIMEN:  N/A  COUNTS:  YES  TOURNIQUET:  * No tourniquets in log *  DICTATION: .Dragon Dictation After informed consent was obtained the patient was brought to the operating room and placed in the supine position on the operating room table. After adequate induction of general anesthesia the patient was moved in the lithotomy position and all pressure points are padded. The perirectal area was prepped with Betadine and draped in usual sterile manner. The patient had a large fluctuant area in the right buttock and perirectal area. An incision was made into the abscess cavity with a 15 blade knife. Cultures were obtained. The abscess cavity was then probed bluntly with a finger to break up all the loculations. The incision was then extended superiorly and inferiorly to make sure the entire cavity was opened and well drained. A large amount of purulent material was evacuated. Hemostasis was then achieved using the Bovie electrocautery. The wound was then packed with moistened Kerlix gauze. Sterile dressings were applied. The patient tolerated the procedure well. At the end of the case all needle sponge and instrument counts were correct. The patient was then awakened and taken to recovery in stable condition.  PLAN OF CARE: Admit to inpatient   PATIENT DISPOSITION:  PACU - hemodynamically stable.   Delay start of Pharmacological VTE agent (>24hrs) due to surgical blood loss or risk of bleeding:  yes

## 2012-07-28 NOTE — Anesthesia Procedure Notes (Signed)
Procedure Name: LMA Insertion Date/Time: 07/28/2012 11:58 AM Performed by: Romie Minus K Pre-anesthesia Checklist: Patient identified, Emergency Drugs available, Suction available, Patient being monitored and Timeout performed Patient Re-evaluated:Patient Re-evaluated prior to inductionOxygen Delivery Method: Circle system utilized Preoxygenation: Pre-oxygenation with 100% oxygen Intubation Type: IV induction Ventilation: Mask ventilation without difficulty and Oral airway inserted - appropriate to patient size LMA: LMA with gastric port inserted LMA Size: 5.0 Number of attempts: 1 Placement Confirmation: positive ETCO2,  CO2 detector and breath sounds checked- equal and bilateral Tube secured with: Tape Dental Injury: Teeth and Oropharynx as per pre-operative assessment

## 2012-07-28 NOTE — Plan of Care (Signed)
Problem: Phase I Progression Outcomes Goal: Incision/dressings dry and intact Outcome: Progressing Dry is soiled with serosanguineous drainage.  Dressing change ordered for 0500hrs 07/29/12.  Patient is having decreased pain to this area.  Will continue to monitor patient condition.

## 2012-07-28 NOTE — Anesthesia Preprocedure Evaluation (Addendum)
Anesthesia Evaluation  Patient identified by MRN, date of birth, ID band Patient awake    Reviewed: Allergy & Precautions, H&P , NPO status , Patient's Chart, lab work & pertinent test results  History of Anesthesia Complications Negative for: history of anesthetic complications  Airway Mallampati: II TM Distance: >3 FB Neck ROM: Full    Dental  (+) Edentulous Upper, Edentulous Lower and Dental Advisory Given   Pulmonary neg pulmonary ROS,          Cardiovascular     Neuro/Psych negative neurological ROS     GI/Hepatic negative GI ROS, Neg liver ROS,   Endo/Other  Morbid obesity  Renal/GU negative Renal ROS     Musculoskeletal   Abdominal   Peds  Hematology negative hematology ROS (+)   Anesthesia Other Findings   Reproductive/Obstetrics negative OB ROS                          Anesthesia Physical Anesthesia Plan  ASA: I  Anesthesia Plan: General   Post-op Pain Management:    Induction: Intravenous  Airway Management Planned: LMA and Oral ETT  Additional Equipment:   Intra-op Plan:   Post-operative Plan: Extubation in OR  Informed Consent: I have reviewed the patients History and Physical, chart, labs and discussed the procedure including the risks, benefits and alternatives for the proposed anesthesia with the patient or authorized representative who has indicated his/her understanding and acceptance.     Plan Discussed with: CRNA, Anesthesiologist and Surgeon  Anesthesia Plan Comments:         Anesthesia Quick Evaluation

## 2012-07-28 NOTE — Progress Notes (Signed)
[facility] PROGRESS NOTE  Aaron Lynch ZOX:[id number] DOB: 23-Oct-1950 DOA: 07/25/2012 PCP: No PCP Per Patient  Assessment/Plan: 1. Cellulitis of buttock- warm compresses,  Wound culture pending, IV abx- wound area is marked- has gone outside of area, firm area palpated- CT scan done but does not appear to go down far enough; consulted surgery for I&D on 7/14 2. Obesity 3. Leukocytosis- decreasing 4. Fever- resolving  Code Status: full Family Communication: patient/wife Disposition Plan: abx   Consultants:  none  Procedures:    Antibiotics:  vanc  HPI/Subjective: Pain continues in area of buttocks  Objective: Filed Vitals:   07/27/12 0537 07/27/12 1517 07/28/12 0021 07/28/12 0612  BP: 103/47 126/64 120/64 99/58  Pulse: 82 90 78 73  Temp: 99.4 F (37.4 C) 98.6 F (37 C) 99.9 F (37.7 C) 98.4 F (36.9 C)  TempSrc: Oral Oral Oral Oral  Resp: 18 18 18 18   Height:      Weight:      SpO2: 95% 96% 93% 95%    Intake/Output Summary (Last 24 hours) at 07/28/12 1007 Last data filed at 07/27/12 1200  Gross per 24 hour  Intake    240 ml  Output      0 ml  Net    240 ml   Filed Weights   07/25/12 2244 07/25/12 2344  Weight: 144.244 kg (318 lb) 142.475 kg (314 lb 1.6 oz)    Exam:   General:  A+Ox3, NAD  Cardiovascular: rrr  Respiratory: clear anterior  Abdomen: +BS, soft  Musculoskeletal: moves all 4 ext   Data Reviewed: Basic Metabolic Panel:  Recent Labs Lab 07/25/12 1804 07/25/12 1946 07/26/12 0410 07/27/12 0500  NA 134* 137 133* 134*  K 4.0 3.7 4.0 3.7  CL 97 98 96 98  CO2 27  --  30 25  GLUCOSE 122* 125* 133* 111*  BUN 10 10 10 10   CREATININE 0.81 1.00 0.93 0.91  CALCIUM 9.1  --  8.5 8.6   Liver Function Tests: No results found for this basename: AST, ALT, ALKPHOS, BILITOT, PROT, ALBUMIN,  in the last 168 hours No results found for this basename: LIPASE, AMYLASE,  in the last 168 hours No results found for this basename:  AMMONIA,  in the last 168 hours CBC:  Recent Labs Lab 07/25/12 1804 07/25/12 1946 07/26/12 0410 07/27/12 0500  WBC 14.4*  --  13.9* 12.7*  NEUTROABS 11.0*  --   --   --   HGB 17.0 17.3* 16.2 15.9  HCT 48.9 51.0 46.6 46.3  MCV 94.4  --  95.1 95.7  PLT 127*  --  120* 126*   Cardiac Enzymes: No results found for this basename: CKTOTAL, CKMB, CKMBINDEX, TROPONINI,  in the last 168 hours BNP (last 3 results) No results found for this basename: PROBNP,  in the last 8760 hours CBG: No results found for this basename: GLUCAP,  in the last 168 hours  Recent Results (from the past 240 hour(s))  CULTURE, ROUTINE-ABSCESS     Status: None   Collection Time    07/26/12 12:30 PM      Result Value Range Status   Specimen Description ABSCESS   Final   Special Requests RIGHT BUTTOCKS   Final   Gram Stain     Final   Value: RARE WBC PRESENT, PREDOMINANTLY PMN     NO SQUAMOUS EPITHELIAL CELLS SEEN     FEW GRAM POSITIVE COCCI IN PAIRS   Culture     Final  Value: FEW STAPHYLOCOCCUS AUREUS     Note: RIFAMPIN AND GENTAMICIN SHOULD NOT BE USED AS SINGLE DRUGS FOR TREATMENT OF STAPH INFECTIONS.   Report Status PENDING   Incomplete  SURGICAL PCR SCREEN     Status: None   Collection Time    07/28/12  5:12 AM      Result Value Range Status   MRSA, PCR NEGATIVE  NEGATIVE Final   Staphylococcus aureus NEGATIVE  NEGATIVE Final   Comment:            The Xpert SA Assay (FDA     approved for NASAL specimens     in patients over 64 years of age),     is one component of     a comprehensive surveillance     program.  Test performance has     been validated by [employer] for patients greater     than or equal to 78 year old.     It is not intended     to diagnose infection nor to     guide or monitor treatment.     Studies: No results found.  Scheduled Meds: . sodium chloride  3 mL Intravenous Q12H  . vancomycin  1,500 mg Intravenous Q12H   Continuous Infusions: . dextrose 5 % and  0.9% NaCl 100 mL/hr at 07/28/12 1610    Principal Problem:   Cellulitis and abscess of buttock Active Problems:   Tachycardia   Systemic inflammatory response syndrome (SIRS)    Time spent: 25    Marlin Canary  [facility] Pager [phone]. If 7PM-7AM, please contact night-coverage at [email or web address], password [facility] 07/28/2012, 10:07 AM  LOS: 3 days

## 2012-07-28 NOTE — Anesthesia Postprocedure Evaluation (Signed)
Anesthesia Post Note  Patient: Aaron Lynch  Procedure(s) Performed: Procedure(s) (LRB): IRRIGATION AND DEBRIDEMENT GLUTEAL ABCESS (N/A)  Anesthesia type: general  Patient location: PACU  Post pain: Pain level controlled  Post assessment: Patient's Cardiovascular Status Stable  Last Vitals:  Filed Vitals:   07/28/12 1245  BP:   Pulse: 102  Temp:   Resp: 17    Post vital signs: Reviewed and stable  Level of consciousness: sedated  Complications: No apparent anesthesia complications

## 2012-07-28 NOTE — H&P (View-Only) (Signed)
Reason for Consult:Abscesses to right gluteal region Referring Physician: Dr. Lonia Mad is an 62 y.o. Lynch.  HPI: the patient is a 62 year old Lynch who is admitted to the hospital approximately 2 days ago for a right gluteal salines. The patient was placed on antibiotics. The area to the patient's gluteus was become more erythematous-looking be an area of fluctuance. Surgical consult obtained.  History reviewed. No pertinent past medical history.  No past surgical history on file.  No family history on file.  Social History:  has no tobacco, alcohol, and drug history on file.  Allergies: No Known Allergies  Medications: I have reviewed the patient's current medications.  Results for orders placed during the hospital encounter of 07/25/12 (from the past 48 hour(s))  CBC WITH DIFFERENTIAL     Status: Abnormal   Collection Time    07/25/12  6:04 PM      Result Value Range   WBC 14.4 (*) 4.0 - 10.5 K/uL   RBC 5.18  4.22 - 5.81 MIL/uL   Hemoglobin 17.0  13.0 - 17.0 g/dL   HCT 11.9  14.7 - 82.9 %   MCV 94.4  78.0 - 100.0 fL   MCH 32.8  26.0 - 34.0 pg   MCHC 34.8  30.0 - Aaron.0 g/dL   RDW 56.2  13.0 - 86.5 %   Platelets 127 (*) 150 - 400 K/uL   Neutrophils Relative % 76  43 - 77 %   Neutro Abs 11.0 (*) 1.7 - 7.7 K/uL   Lymphocytes Relative 13  12 - 46 %   Lymphs Abs 1.9  0.7 - 4.0 K/uL   Monocytes Relative 10  3 - 12 %   Monocytes Absolute 1.5 (*) 0.1 - 1.0 K/uL   Eosinophils Relative 0  0 - 5 %   Eosinophils Absolute 0.1  0.0 - 0.7 K/uL   Basophils Relative 0  0 - 1 %   Basophils Absolute 0.0  0.0 - 0.1 K/uL  BASIC METABOLIC PANEL     Status: Abnormal   Collection Time    07/25/12  6:04 PM      Result Value Range   Sodium 134 (*) 135 - 145 mEq/L   Potassium 4.0  3.5 - 5.1 mEq/L   Chloride 97  96 - 112 mEq/L   CO2 27  19 - 32 mEq/L   Glucose, Bld 122 (*) 70 - 99 mg/dL   BUN 10  6 - 23 mg/dL   Creatinine, Ser 7.84  0.50 - 1.35 mg/dL   Calcium 9.1  8.4 - 69.6  mg/dL   GFR calc non Af Amer >90  >90 mL/min   GFR calc Af Amer >90  >90 mL/min   Comment:            The eGFR has been calculated     using the CKD EPI equation.     This calculation has not been     validated in all clinical     situations.     eGFR's persistently     <90 mL/min signify     possible Chronic Kidney Disease.  POCT I-STAT, CHEM 8     Status: Abnormal   Collection Time    07/25/12  7:46 PM      Result Value Range   Sodium 137  135 - 145 mEq/L   Potassium 3.7  3.5 - 5.1 mEq/L   Chloride 98  96 - 112 mEq/L   BUN 10  6 - 23 mg/dL   Creatinine, Ser 1.61  0.50 - 1.35 mg/dL   Glucose, Bld 096 (*) 70 - 99 mg/dL   Calcium, Ion 0.45  4.09 - 1.30 mmol/L   TCO2 26  0 - 100 mmol/L   Hemoglobin 17.3 (*) 13.0 - 17.0 g/dL   HCT 81.1  91.4 - 78.2 %  CG4 I-STAT (LACTIC ACID)     Status: None   Collection Time    07/25/12 10:52 PM      Result Value Range   Lactic Acid, Venous 1.30  0.5 - 2.2 mmol/L  BASIC METABOLIC PANEL     Status: Abnormal   Collection Time    07/26/12  4:10 AM      Result Value Range   Sodium 133 (*) 135 - 145 mEq/L   Potassium 4.0  3.5 - 5.1 mEq/L   Chloride 96  96 - 112 mEq/L   CO2 30  19 - 32 mEq/L   Glucose, Bld 133 (*) 70 - 99 mg/dL   BUN 10  6 - 23 mg/dL   Creatinine, Ser 9.56  0.50 - 1.35 mg/dL   Calcium 8.5  8.4 - 21.3 mg/dL   GFR calc non Af Amer 88 (*) >90 mL/min   GFR calc Af Amer >90  >90 mL/min   Comment:            The eGFR has been calculated     using the CKD EPI equation.     This calculation has not been     validated in all clinical     situations.     eGFR's persistently     <90 mL/min signify     possible Chronic Kidney Disease.  CBC     Status: Abnormal   Collection Time    07/26/12  4:10 AM      Result Value Range   WBC 13.9 (*) 4.0 - 10.5 K/uL   RBC 4.90  4.22 - 5.81 MIL/uL   Hemoglobin 16.2  13.0 - 17.0 g/dL   HCT 08.6  57.8 - 46.9 %   MCV 95.1  78.0 - 100.0 fL   MCH 33.1  26.0 - 34.0 pg   MCHC 34.8  30.0 - Aaron.0  g/dL   RDW 62.9  52.8 - 41.3 %   Platelets 120 (*) 150 - 400 K/uL  CULTURE, ROUTINE-ABSCESS     Status: None   Collection Time    07/26/12 12:30 PM      Result Value Range   Specimen Description ABSCESS     Special Requests RIGHT BUTTOCKS     Gram Stain       Value: RARE WBC PRESENT, PREDOMINANTLY PMN     NO SQUAMOUS EPITHELIAL CELLS SEEN     FEW GRAM POSITIVE COCCI IN PAIRS   Culture Culture reincubated for better growth     Report Status PENDING    CBC     Status: Abnormal   Collection Time    07/27/12  5:00 AM      Result Value Range   WBC 12.7 (*) 4.0 - 10.5 K/uL   RBC 4.84  4.22 - 5.81 MIL/uL   Hemoglobin 15.9  13.0 - 17.0 g/dL   HCT 24.4  01.0 - 27.2 %   MCV 95.7  78.0 - 100.0 fL   MCH 32.9  26.0 - 34.0 pg   MCHC 34.3  30.0 - Aaron.0 g/dL   RDW 53.6  64.4 - 03.4 %  Platelets 126 (*) 150 - 400 K/uL  BASIC METABOLIC PANEL     Status: Abnormal   Collection Time    07/27/12  5:00 AM      Result Value Range   Sodium 134 (*) 135 - 145 mEq/L   Potassium 3.7  3.5 - 5.1 mEq/L   Chloride 98  96 - 112 mEq/L   CO2 25  19 - 32 mEq/L   Glucose, Bld 111 (*) 70 - 99 mg/dL   BUN 10  6 - 23 mg/dL   Creatinine, Ser 1.61  0.50 - 1.35 mg/dL   Calcium 8.6  8.4 - 09.6 mg/dL   GFR calc non Af Amer 89 (*) >90 mL/min   GFR calc Af Amer >90  >90 mL/min   Comment:            The eGFR has been calculated     using the CKD EPI equation.     This calculation has not been     validated in all clinical     situations.     eGFR's persistently     <90 mL/min signify     possible Chronic Kidney Disease.    Ct Pelvis W Contrast  07/25/2012   *RADIOLOGY REPORT*  Clinical Data:  Right buttock abscess.  CT PELVIS WITH CONTRAST  Technique:  Multidetector CT imaging of the pelvis was performed using the standard protocol following the bolus administration of intravenous contrast.  Contrast: OMNIPAQUE IOHEXOL 300 MG/ML  SOLN  Comparison:   None.  Findings:  Soft tissue stranding in the posterior  subcutaneous fat on the right, inferiorly.  No fluid collections are seen on these images.  No soft tissue gas.  Unremarkable bones.  Bilateral inguinal hernias containing fat.  Horseshoe kidney on the first images.  Unremarkable bowel loops.  Coarse prostatic calcifications.  IMPRESSION:  1.  Right posterior subcutaneous edema with no visible fluid collection on these images.  The inferior extent of the edema is not included. 2.  Bilateral inguinal hernias containing fat. 3.  Horseshoe kidney.   Original Report Authenticated By: Beckie Salts, M.D.    Review of Systems  Unable to perform ROS Constitutional: Negative.   HENT: Negative.   Eyes: Negative.   Respiratory: Negative.   Cardiovascular: Negative.   Gastrointestinal: Negative.   Genitourinary: Negative.   Musculoskeletal: Negative.   Skin: Negative.   Neurological: Negative.    Blood pressure 126/64, pulse 90, temperature 98.6 F (37 C), temperature source Oral, resp. rate 18, height 6\' 2"  (1.88 m), weight 314 lb 1.6 oz (142.475 kg), SpO2 96.00%. Physical Exam  Constitutional: He is oriented to person, place, and time. He appears well-developed and well-nourished.  HENT:  Head: Normocephalic and atraumatic.  Eyes: Conjunctivae and EOM are normal. Pupils are equal, round, and reactive to light.  Neck: Normal range of motion. Neck supple.  Cardiovascular: Normal rate, regular rhythm and normal heart sounds.   Respiratory: Effort normal.  GI: Bowel sounds are normal.  Genitourinary:     Neurological: He is alert and oriented to person, place, and time.  Skin: Skin is warm and dry.    Assessment/Plan: 62 year old Lynch with a right gluteal abscess. 1. Will make patient n.p.o. At this time and the patient consented for a I&D of her right gluteal abscess. 2. We'll schedule patient for a I&D of gluteal abscess in the a.m.   Marigene Ehlers., Lin Glazier 07/27/2012, 5:38 PM

## 2012-07-29 ENCOUNTER — Encounter (HOSPITAL_COMMUNITY): Payer: Self-pay | Admitting: General Surgery

## 2012-07-29 LAB — BASIC METABOLIC PANEL
CO2: 25 mEq/L (ref 19–32)
Calcium: 8.5 mg/dL (ref 8.4–10.5)
Creatinine, Ser: 0.76 mg/dL (ref 0.50–1.35)
Glucose, Bld: 121 mg/dL — ABNORMAL HIGH (ref 70–99)
Sodium: 135 mEq/L (ref 135–145)

## 2012-07-29 LAB — CBC
Hemoglobin: 14.6 g/dL (ref 13.0–17.0)
MCH: 32.6 pg (ref 26.0–34.0)
MCV: 95.1 fL (ref 78.0–100.0)
RBC: 4.48 MIL/uL (ref 4.22–5.81)

## 2012-07-29 LAB — CULTURE, ROUTINE-ABSCESS

## 2012-07-29 NOTE — Progress Notes (Signed)
Patient ID: Aaron Lynch, male   DOB: Dec 16, 1950, 62 y.o.   MRN: [id number] 1 Day Post-Op  Subjective: Buttocks tender but better, pain controlled, denies fevers  Objective: Vital signs in last 24 hours: Temp:  [97.3 F (36.3 C)-99.1 F (37.3 C)] 99.1 F (37.3 C) (07/15 0605) Pulse Rate:  [86-102] 90 (07/15 0605) Resp:  [13-26] 16 (07/15 0605) BP: (117-143)/(65-88) 131/65 mmHg (07/15 0605) SpO2:  [95 %-100 %] 95 % (07/15 0605) Last BM Date: 07/25/12  Intake/Output from previous day: 07/14 0701 - 07/15 0700 In: 1803 [P.O.:300; I.V.:1003; IV Piggyback:500] Out: 270 [Urine:270] Intake/Output this shift:    PE: Buttocks: right medial buttocks wound with some necrotic tissue in the base and some surroundign erythema although improved Cultures MRSA sensitive to bactrim  Lab Results:   Recent Labs  07/27/12 0500 07/29/12 0615  WBC 12.7* 7.3  HGB 15.9 14.6  HCT 46.3 42.6  PLT 126* 150   BMET  Recent Labs  07/27/12 0500 07/29/12 0615  NA 134* 135  K 3.7 3.7  CL 98 101  CO2 25 25  GLUCOSE 111* 121*  BUN 10 8  CREATININE 0.91 0.76  CALCIUM 8.6 8.5   PT/INR No results found for this basename: LABPROT, INR,  in the last 72 hours CMP     Component Value Date/Time   NA 135 07/29/2012 0615   K 3.7 07/29/2012 0615   CL 101 07/29/2012 0615   CO2 25 07/29/2012 0615   GLUCOSE 121* 07/29/2012 0615   BUN 8 07/29/2012 0615   CREATININE 0.76 07/29/2012 0615   CALCIUM 8.5 07/29/2012 0615   GFRNONAA >90 07/29/2012 0615   GFRAA >90 07/29/2012 0615   Lipase  No results found for this basename: lipase       Studies/Results: No results found.  Anti-infectives: Anti-infectives   Start     Dose/Rate Route Frequency Ordered Stop   07/26/12 0900  vancomycin (VANCOCIN) 1,500 mg in sodium chloride 0.9 % 500 mL IVPB     1,500 mg 250 mL/hr over 120 Minutes Intravenous Every 12 hours 07/25/12 1959     07/25/12 1830  vancomycin (VANCOCIN) 2,000 mg in sodium chloride 0.9 % 500 mL  IVPB     2,000 mg 250 mL/hr over 120 Minutes Intravenous To Emergency Dept 07/25/12 1811 07/25/12 2310       Assessment/Plan Right medial buttocks abscess: improved with i and d, MRSA sens to bactrim, would recommend one more day of IV abx and dressing changes then likely home tomorrow with family to do dressing changes.  BID wet to dry.   LOS: 4 days    Jacksyn Beeks 07/29/2012

## 2012-07-29 NOTE — Progress Notes (Signed)
[facility] PROGRESS NOTE  Aaron Lynch RUE:[id number] DOB: 05/22/1950 DOA: 07/25/2012 PCP: No PCP Per Patient  Assessment/Plan: 1. Cellulitis of buttock- warm compresses,  Wound culture MRSA- await culture done in surgery; 1st culture sensitive to PO abx (bactrim), IV abx; surgery did I&D on 7/14- WBC count back to normal- wound care noted 2. Obesity 3. Leukocytosis- decreasing 4. Fever- resolving  Code Status: full Family Communication: patient Disposition Plan: abx   Consultants:  none  Procedures:    Antibiotics:  vanc  HPI/Subjective: Up walking around unit Pain in buttocks but better  Objective: Filed Vitals:   07/28/12 1439 07/28/12 1500 07/28/12 2107 07/29/12 0605  BP:   121/73 131/65  Pulse: 88 90 86 90  Temp:  98.5 F (36.9 C) 99 F (37.2 C) 99.1 F (37.3 C)  TempSrc:  Oral Oral Oral  Resp: 26  16 16   Height:      Weight:      SpO2: 99% 95% 96% 95%    Intake/Output Summary (Last 24 hours) at 07/29/12 0853 Last data filed at 07/28/12 2231  Gross per 24 hour  Intake   1803 ml  Output    270 ml  Net   1533 ml   Filed Weights   07/25/12 2244 07/25/12 2344  Weight: 144.244 kg (318 lb) 142.475 kg (314 lb 1.6 oz)    Exam:   General:  A+Ox3, NAD  Cardiovascular: rrr  Respiratory: clear anterior  Abdomen: +BS, soft  Musculoskeletal: moves all 4 ext   Data Reviewed: Basic Metabolic Panel:  Recent Labs Lab 07/25/12 1804 07/25/12 1946 07/26/12 0410 07/27/12 0500 07/29/12 0615  NA 134* 137 133* 134* 135  K 4.0 3.7 4.0 3.7 3.7  CL 97 98 96 98 101  CO2 27  --  30 25 25   GLUCOSE 122* 125* 133* 111* 121*  BUN 10 10 10 10 8   CREATININE 0.81 1.00 0.93 0.91 0.76  CALCIUM 9.1  --  8.5 8.6 8.5   Liver Function Tests: No results found for this basename: AST, ALT, ALKPHOS, BILITOT, PROT, ALBUMIN,  in the last 168 hours No results found for this basename: LIPASE, AMYLASE,  in the last 168 hours No results found for this basename:  AMMONIA,  in the last 168 hours CBC:  Recent Labs Lab 07/25/12 1804 07/25/12 1946 07/26/12 0410 07/27/12 0500 07/29/12 0615  WBC 14.4*  --  13.9* 12.7* 7.3  NEUTROABS 11.0*  --   --   --   --   HGB 17.0 17.3* 16.2 15.9 14.6  HCT 48.9 51.0 46.6 46.3 42.6  MCV 94.4  --  95.1 95.7 95.1  PLT 127*  --  120* 126* 150   Cardiac Enzymes: No results found for this basename: CKTOTAL, CKMB, CKMBINDEX, TROPONINI,  in the last 168 hours BNP (last 3 results) No results found for this basename: PROBNP,  in the last 8760 hours CBG: No results found for this basename: GLUCAP,  in the last 168 hours  Recent Results (from the past 240 hour(s))  CULTURE, ROUTINE-ABSCESS     Status: None   Collection Time    07/26/12 12:30 PM      Result Value Range Status   Specimen Description ABSCESS   Final   Special Requests RIGHT BUTTOCKS   Final   Gram Stain     Final   Value: RARE WBC PRESENT, PREDOMINANTLY PMN     NO SQUAMOUS EPITHELIAL CELLS SEEN     FEW GRAM POSITIVE  COCCI IN PAIRS   Culture     Final   Value: FEW METHICILLIN RESISTANT STAPHYLOCOCCUS AUREUS     Note: RIFAMPIN AND GENTAMICIN SHOULD NOT BE USED AS SINGLE DRUGS FOR TREATMENT OF STAPH INFECTIONS. This organism DOES NOT demonstrate inducible Clindamycin resistance in vitro. CRITICAL RESULT CALLED TO, READ BACK BY AND VERIFIED WITH: BECKA H 07/29/12      AT 825 AM BY [facility]   Report Status 07/29/2012 FINAL   Final   Organism ID, Bacteria METHICILLIN RESISTANT STAPHYLOCOCCUS AUREUS   Final  SURGICAL PCR SCREEN     Status: None   Collection Time    07/28/12  5:12 AM      Result Value Range Status   MRSA, PCR NEGATIVE  NEGATIVE Final   Staphylococcus aureus NEGATIVE  NEGATIVE Final   Comment:            The Xpert SA Assay (FDA     approved for NASAL specimens     in patients over 45 years of age),     is one component of     a comprehensive surveillance     program.  Test performance has     been validated by [employer] for  patients greater     than or equal to 33 year old.     It is not intended     to diagnose infection nor to     guide or monitor treatment.  ANAEROBIC CULTURE     Status: None   Collection Time    07/28/12 12:08 PM      Result Value Range Status   Specimen Description ABSCESS RIGHT BUTTOCKS   Final   Special Requests PATIENT ON FOLLOWING VANCOMYCIN   Final   Gram Stain     Final   Value: NO WBC SEEN     NO SQUAMOUS EPITHELIAL CELLS SEEN     FEW GRAM POSITIVE COCCI IN PAIRS   Culture PENDING   Incomplete   Report Status PENDING   Incomplete  CULTURE, ROUTINE-ABSCESS     Status: None   Collection Time    07/28/12 12:08 PM      Result Value Range Status   Specimen Description ABSCESS RIGHT BUTTOCKS   Final   Special Requests PATIENT ON FOLLOWING VANCOMYCIN   Final   Gram Stain     Final   Value: NO WBC SEEN     NO SQUAMOUS EPITHELIAL CELLS SEEN     FEW GRAM POSITIVE COCCI IN PAIRS   Culture PENDING   Incomplete   Report Status PENDING   Incomplete     Studies: No results found.  Scheduled Meds: . sodium chloride  3 mL Intravenous Q12H  . vancomycin  1,500 mg Intravenous Q12H   Continuous Infusions:    Principal Problem:   Cellulitis and abscess of buttock Active Problems:   Tachycardia   Systemic inflammatory response syndrome (SIRS)    Time spent: 25    Marlin Canary  [facility] Pager [phone]. If 7PM-7AM, please contact night-coverage at [email or web address], password [facility] 07/29/2012, 8:53 AM  LOS: 4 days

## 2012-07-30 MED ORDER — DOCUSATE SODIUM 100 MG PO CAPS: 100.0000 mg | ORAL_CAPSULE | Freq: Two times a day (BID) | ORAL | Status: DC | PRN

## 2012-07-30 MED ORDER — HYDROCODONE-ACETAMINOPHEN 5-325 MG PO TABS: 1.0000 | ORAL_TABLET | ORAL | Status: DC | PRN

## 2012-07-30 MED ORDER — SULFAMETHOXAZOLE-TMP DS 800-160 MG PO TABS: 1.0000 | ORAL_TABLET | Freq: Two times a day (BID) | ORAL | Status: DC

## 2012-07-30 NOTE — Progress Notes (Signed)
Patient ID: Aaron Lynch, male   DOB: 1950/10/03, 62 y.o.   MRN: [id number] 2 Days Post-Op  Subjective: Buttocks tender but better, pain controlled, ready to go home  Objective: Vital signs in last 24 hours: Temp:  [98 F (36.7 C)-98.6 F (37 C)] 98 F (36.7 C) (07/16 0548) Pulse Rate:  [64-70] 69 (07/16 0548) Resp:  [18] 18 (07/16 0548) BP: (99-132)/(40-78) 99/40 mmHg (07/16 0548) SpO2:  [93 %-98 %] 93 % (07/16 0548) Last BM Date: 07/25/12  Intake/Output from previous day: 07/15 0701 - 07/16 0700 In: 563 [P.O.:560; I.V.:3] Out: -  Intake/Output this shift: Total I/O In: 363 [P.O.:360; I.V.:3] Out: -   PE: Buttocks: right medial buttocks wound with less necrotic tissue in the base and much healthier today Cultures MRSA sensitive to bactrim  Lab Results:   Recent Labs  07/29/12 0615  WBC 7.3  HGB 14.6  HCT 42.6  PLT 150   BMET  Recent Labs  07/29/12 0615  NA 135  K 3.7  CL 101  CO2 25  GLUCOSE 121*  BUN 8  CREATININE 0.76  CALCIUM 8.5   PT/INR No results found for this basename: LABPROT, INR,  in the last 72 hours CMP     Component Value Date/Time   NA 135 07/29/2012 0615   K 3.7 07/29/2012 0615   CL 101 07/29/2012 0615   CO2 25 07/29/2012 0615   GLUCOSE 121* 07/29/2012 0615   BUN 8 07/29/2012 0615   CREATININE 0.76 07/29/2012 0615   CALCIUM 8.5 07/29/2012 0615   GFRNONAA >90 07/29/2012 0615   GFRAA >90 07/29/2012 0615   Lipase  No results found for this basename: lipase       Studies/Results: No results found.  Anti-infectives: Anti-infectives   Start     Dose/Rate Route Frequency Ordered Stop   07/30/12 0000  sulfamethoxazole-trimethoprim (BACTRIM DS) 800-160 MG per tablet     1 tablet Oral 2 times daily 07/30/12 1038     07/26/12 0900  vancomycin (VANCOCIN) 1,500 mg in sodium chloride 0.9 % 500 mL IVPB     1,500 mg 250 mL/hr over 120 Minutes Intravenous Every 12 hours 07/25/12 1959     07/25/12 1830  vancomycin (VANCOCIN) 2,000 mg in  sodium chloride 0.9 % 500 mL IVPB     2,000 mg 250 mL/hr over 120 Minutes Intravenous To Emergency Dept 07/25/12 1811 07/25/12 2310       Assessment/Plan Right medial buttocks abscess: improved with i and d, MRSA sens to bactrim, looks much better today, ok to discharge home on bactrim, I have left a Rx for bactrim and his pain med.  His wife will do the dressing changes.  Explained it to her.  Also will have him do sitz baths after any BM.  He will see [place] on Tuesday for wound check.   LOS: 5 days    Raihana Balderrama 07/30/2012

## 2012-07-30 NOTE — Discharge Summary (Signed)
Physician Discharge Summary  DUARD SPIEWAK ZOX:[id number] DOB: 08/27/1950 DOA: 07/25/2012  PCP: No PCP Per Patient  Admit date: 07/25/2012 Discharge date: 07/30/2012  Time spent: 35 minutes  Recommendations for Outpatient Follow-up:  1. Follow up with surgery in 1 week  Discharge Diagnoses:  Principal Problem:   Cellulitis and abscess of buttock Active Problems:   Tachycardia   Systemic inflammatory response syndrome (SIRS)   Discharge Condition: improved  Diet recommendation: regular  Filed Weights   07/25/12 2244 07/25/12 2344  Weight: 144.244 kg (318 lb) 142.475 kg (314 lb 1.6 oz)    History of present illness:  62 yo Lynch healthy with 2 days of worsening pain to right buttock. Wife saw a pinhead lesion in the area and "popped" it last night, has some drainage from it last night. Today is more red and painful. No recent antibiotics. No n/v. No rectal pain. No fevers at home, but febrile here over 102. Eating well.    Hospital Course:  1. Cellulitis of buttock- warm compresses, Wound culture MRSA- await culture done in surgery; 1st culture sensitive to PO abx (bactrim); surgery did I&D on 7/14- WBC count back to normal- wound care noted- follow up in their clinic in 1 week 2. Obesity 3. Leukocytosis- decreasing 4. Fever- resolving   Procedures:  I&D  Consultations:  surgery  Discharge Exam: Filed Vitals:   07/29/12 0605 07/29/12 1346 07/29/12 2035 07/30/12 0548  BP: 131/65 115/75 132/78 99/40  Pulse: 90 64 70 69  Temp: 99.1 F (37.3 C) 98.3 F (36.8 C) 98.6 F (37 C) 98 F (36.7 C)  TempSrc: Oral Oral Oral Oral  Resp: 16 18 18 18   Height:      Weight:      SpO2: 95% 97% 98% 93%    General: A+Ox3, NAD Cardiovascular: rrr Respiratory: clear anterior  Discharge Instructions  Discharge Orders   Future Appointments Provider Department Dept Phone   08/05/2012 10:00 AM [facility], [place] [phone]   Future Orders  Complete By Expires     Diet - low sodium heart healthy  As directed     Discharge instructions  As directed     Comments:      Wound care: wet to dry BID SITZ bath  Follow up with surgery in 1 week PCP in 1-2 weeks    Increase activity slowly  As directed         Medication List         aspirin EC 81 MG tablet  Take 81 mg by mouth daily.     docusate sodium 100 MG capsule  Commonly known as:  COLACE  Take 1 capsule (100 mg total) by mouth 2 (two) times daily as needed for constipation.     HYDROcodone-acetaminophen 5-325 MG per tablet  Commonly known as:  NORCO/VICODIN  Take 1-2 tablets by mouth every 4 (four) hours as needed.     sulfamethoxazole-trimethoprim 800-160 MG per tablet  Commonly known as:  BACTRIM DS  Take 1 tablet by mouth 2 (two) times daily.     vitamin E 1000 UNIT capsule  Take 1,000 Units by mouth daily.       No Known Allergies     Follow-up Information   Follow up with [facility] On 08/05/2012. (arrive at 9:30 to fill out new patient forms.  Appt will be at 10am)    Contact information:   [address]  [address]   [place] [address] [phone]        The results of significant diagnostics from this hospitalization (including imaging, microbiology, ancillary and laboratory) are listed below for reference.    Significant Diagnostic Studies: Ct Pelvis W Contrast  07/25/2012   *RADIOLOGY REPORT*  Clinical Data:  Right buttock abscess.  CT PELVIS WITH CONTRAST  Technique:  Multidetector CT imaging of the pelvis was performed using the standard protocol following the bolus administration of intravenous contrast.  Contrast: OMNIPAQUE IOHEXOL 300 MG/ML  SOLN  Comparison:   None.  Findings:  Soft tissue stranding in the posterior subcutaneous fat on the right, inferiorly.  No fluid collections are seen on these images.  No soft tissue gas.  Unremarkable bones.  Bilateral inguinal hernias containing fat.  Horseshoe kidney on  the first images.  Unremarkable bowel loops.  Coarse prostatic calcifications.  IMPRESSION:  1.  Right posterior subcutaneous edema with no visible fluid collection on these images.  The inferior extent of the edema is not included. 2.  Bilateral inguinal hernias containing fat. 3.  Horseshoe kidney.   Original Report Authenticated By: Beckie Salts, M.D.    Microbiology: Recent Results (from the past 240 hour(s))  CULTURE, ROUTINE-ABSCESS     Status: None   Collection Time    07/26/12 12:30 PM      Result Value Range Status   Specimen Description ABSCESS   Final   Special Requests RIGHT BUTTOCKS   Final   Gram Stain     Final   Value: RARE WBC PRESENT, PREDOMINANTLY PMN     NO SQUAMOUS EPITHELIAL CELLS SEEN     FEW GRAM POSITIVE COCCI IN PAIRS   Culture     Final   Value: FEW METHICILLIN RESISTANT STAPHYLOCOCCUS AUREUS     Note: RIFAMPIN AND GENTAMICIN SHOULD NOT BE USED AS SINGLE DRUGS FOR TREATMENT OF STAPH INFECTIONS. This organism DOES NOT demonstrate inducible Clindamycin resistance in vitro. CRITICAL RESULT CALLED TO, READ BACK BY AND VERIFIED WITH: BECKA H 07/29/12      AT 825 AM BY [facility]   Report Status 07/29/2012 FINAL   Final   Organism ID, Bacteria METHICILLIN RESISTANT STAPHYLOCOCCUS AUREUS   Final  SURGICAL PCR SCREEN     Status: None   Collection Time    07/28/12  5:12 AM      Result Value Range Status   MRSA, PCR NEGATIVE  NEGATIVE Final   Staphylococcus aureus NEGATIVE  NEGATIVE Final   Comment:            The Xpert SA Assay (FDA     approved for NASAL specimens     in patients over 65 years of age),     is one component of     a comprehensive surveillance     program.  Test performance has     been validated by [employer] for patients greater     than or equal to 29 year old.     It is not intended     to diagnose infection nor to     guide or monitor treatment.  ANAEROBIC CULTURE     Status: None   Collection Time    07/28/12 12:08 PM      Result Value  Range Status   Specimen Description ABSCESS RIGHT BUTTOCKS   Final   Special Requests PATIENT ON FOLLOWING VANCOMYCIN   Final   Gram Stain     Final  Value: NO WBC SEEN     NO SQUAMOUS EPITHELIAL CELLS SEEN     FEW GRAM POSITIVE COCCI IN PAIRS   Culture     Final   Value: NO ANAEROBES ISOLATED; CULTURE IN PROGRESS FOR 5 DAYS   Report Status PENDING   Incomplete  CULTURE, ROUTINE-ABSCESS     Status: None   Collection Time    07/28/12 12:08 PM      Result Value Range Status   Specimen Description ABSCESS RIGHT BUTTOCKS   Final   Special Requests PATIENT ON FOLLOWING VANCOMYCIN   Final   Gram Stain     Final   Value: NO WBC SEEN     NO SQUAMOUS EPITHELIAL CELLS SEEN     FEW GRAM POSITIVE COCCI IN PAIRS   Culture     Final   Value: MODERATE STAPHYLOCOCCUS AUREUS     Note: RIFAMPIN AND GENTAMICIN SHOULD NOT BE USED AS SINGLE DRUGS FOR TREATMENT OF STAPH INFECTIONS.   Report Status PENDING   Incomplete     Labs: Basic Metabolic Panel:  Recent Labs Lab 07/25/12 1804 07/25/12 1946 07/26/12 0410 07/27/12 0500 07/29/12 0615  NA 134* 137 133* 134* 135  K 4.0 3.7 4.0 3.7 3.7  CL 97 98 96 98 101  CO2 27  --  30 25 25   GLUCOSE 122* 125* 133* 111* 121*  BUN 10 10 10 10 8   CREATININE 0.81 1.00 0.93 0.91 0.76  CALCIUM 9.1  --  8.5 8.6 8.5   Liver Function Tests: No results found for this basename: AST, ALT, ALKPHOS, BILITOT, PROT, ALBUMIN,  in the last 168 hours No results found for this basename: LIPASE, AMYLASE,  in the last 168 hours No results found for this basename: AMMONIA,  in the last 168 hours CBC:  Recent Labs Lab 07/25/12 1804 07/25/12 1946 07/26/12 0410 07/27/12 0500 07/29/12 0615  WBC 14.4*  --  13.9* 12.7* 7.3  NEUTROABS 11.0*  --   --   --   --   HGB 17.0 17.3* 16.2 15.9 14.6  HCT 48.9 51.0 46.6 46.3 42.6  MCV 94.4  --  95.1 95.7 95.1  PLT 127*  --  120* 126* 150   Cardiac Enzymes: No results found for this basename: CKTOTAL, CKMB, CKMBINDEX,  TROPONINI,  in the last 168 hours BNP: BNP (last 3 results) No results found for this basename: PROBNP,  in the last 8760 hours CBG: No results found for this basename: GLUCAP,  in the last 168 hours     Signed:  Benjamine Mola, Sheliah Fiorillo  [facility] 07/30/2012, 11:35 AM

## 2012-07-30 NOTE — Progress Notes (Signed)
Patient discharged to home accompanied by wife. Discharge instructions and rx given and explained and patient stated understanding. Showed patient's wife how to change dressing. IVs were removed. Patient left unit in a stable condition via wheelchair.

## 2012-07-30 NOTE — Discharge Instructions (Signed)
Wet to dry dressing changes to buttocks wound twice daily Do sitz bath with warm, soapy water after any bowel movement Ok to shower

## 2012-07-31 LAB — CULTURE, ROUTINE-ABSCESS: Gram Stain: NONE SEEN

## 2012-08-02 LAB — ANAEROBIC CULTURE

## 2012-08-04 ENCOUNTER — Telehealth (INDEPENDENT_AMBULATORY_CARE_PROVIDER_SITE_OTHER): Payer: Self-pay

## 2012-08-04 NOTE — Telephone Encounter (Signed)
The patient's wife called to report he was on Bactrim but it caused an itchy rash all over.  He stopped taking it on Saturday.  He has no fever.  She states the wound is healing and closing up some.  He has an appointment tomorrow in [facility].  I told her we can just wait until he is seen to determine if he needs more antibiotics but someone will call back if they feel differently.

## 2012-08-05 ENCOUNTER — Encounter (INDEPENDENT_AMBULATORY_CARE_PROVIDER_SITE_OTHER): Payer: Self-pay | Admitting: Internal Medicine

## 2012-08-05 ENCOUNTER — Ambulatory Visit (INDEPENDENT_AMBULATORY_CARE_PROVIDER_SITE_OTHER): Payer: Medicare Other | Admitting: Internal Medicine

## 2012-08-05 VITALS — BP 132/90 | HR 96 | Temp 97.8°F | Resp 16 | Ht 74.0 in | Wt 300.8 lb

## 2012-08-05 DIAGNOSIS — L0231 Cutaneous abscess of buttock: Secondary | ICD-10-CM

## 2012-08-05 DIAGNOSIS — L03317 Cellulitis of buttock: Secondary | ICD-10-CM

## 2012-08-05 NOTE — Progress Notes (Signed)
  Subjective: Pt returns to the clinic today after being hospitalized for a buttocks abscess.  He underwent IRRIGATION AND DEBRIDEMENT GLUTEAL ABCESS on 07/28/12 by Dr. Carolynne Edouard.  He had an allegric reaction to the bactrim that we sent him home on and stopped taking it.  The wound is doing well and he is having no problems.  His wife is doing the dressing changes.  Objective: Vital signs in last 24 hours: Reviewed  PE: Buttocks: wound is filling in well, good granulation tissue min slough in base, min induration, healing well  Lab Results:  No results found for this basename: WBC, HGB, HCT, PLT,  in the last 72 hours BMET No results found for this basename: NA, K, CL, CO2, GLUCOSE, BUN, CREATININE, CALCIUM,  in the last 72 hours PT/INR No results found for this basename: LABPROT, INR,  in the last 72 hours CMP     Component Value Date/Time   NA 135 07/29/2012 0615   K 3.7 07/29/2012 0615   CL 101 07/29/2012 0615   CO2 25 07/29/2012 0615   GLUCOSE 121* 07/29/2012 0615   BUN 8 07/29/2012 0615   CREATININE 0.76 07/29/2012 0615   CALCIUM 8.5 07/29/2012 0615   GFRNONAA >90 07/29/2012 0615   GFRAA >90 07/29/2012 0615   Lipase  No results found for this basename: lipase       Studies/Results: No results found.  Anti-infectives: Anti-infectives   None       Assessment/Plan  1.  S/P Buttocks abscess I and D: doing well, healing nicely, continue dressing changes, f/u in 3 weeks or sooner if needed.     WHITE, ELIZABETH 08/05/2012

## 2012-08-05 NOTE — Patient Instructions (Signed)
Continue wet to dry dressing changes

## 2012-08-26 ENCOUNTER — Encounter (INDEPENDENT_AMBULATORY_CARE_PROVIDER_SITE_OTHER): Payer: Self-pay

## 2012-08-26 ENCOUNTER — Ambulatory Visit (INDEPENDENT_AMBULATORY_CARE_PROVIDER_SITE_OTHER): Payer: Medicare Other | Admitting: General Surgery

## 2012-08-26 VITALS — BP 128/62 | HR 60 | Temp 97.1°F | Resp 16 | Ht 74.0 in | Wt 305.2 lb

## 2012-08-26 DIAGNOSIS — L0231 Cutaneous abscess of buttock: Secondary | ICD-10-CM

## 2012-08-26 DIAGNOSIS — L03317 Cellulitis of buttock: Secondary | ICD-10-CM

## 2012-08-26 DIAGNOSIS — T814XXD Infection following a procedure, subsequent encounter: Secondary | ICD-10-CM

## 2012-08-26 DIAGNOSIS — Z5189 Encounter for other specified aftercare: Secondary | ICD-10-CM

## 2012-08-26 NOTE — Patient Instructions (Addendum)
Follow up in one month Switch to daily dressing changes with vaseline gauze

## 2012-08-26 NOTE — Progress Notes (Signed)
Aaron Lynch 12/07/1950 [id number]  This 62 yo male presents for a wound check.  He had an I&D of buttock abscess on 07-28-12 by Dr. Carolynne Edouard.  He has been doing NS WD Dressing changes BID at home.  He is doing well.  PE: GU: right buttock with very well healing wound.  There is no depth, but still approximately 2.5x1cm.  100% granulation tissue.  A/P 1. S/p I&D of right buttock abscess  Plan: 1. Switch to once a day dressing changes with vaseline guaze only.  RTC in 1 month.  If his wound is completely healed by then and he has no other concerns he may cancel appointment if he feels so.

## 2012-09-23 ENCOUNTER — Encounter (INDEPENDENT_AMBULATORY_CARE_PROVIDER_SITE_OTHER): Payer: Medicare Other

## 2012-12-26 ENCOUNTER — Encounter: Payer: Self-pay | Admitting: Internal Medicine

## 2013-02-20 ENCOUNTER — Ambulatory Visit (AMBULATORY_SURGERY_CENTER): Payer: Self-pay | Admitting: *Deleted

## 2013-02-20 VITALS — Ht 73.0 in | Wt 319.2 lb

## 2013-02-20 DIAGNOSIS — Z1211 Encounter for screening for malignant neoplasm of colon: Secondary | ICD-10-CM

## 2013-02-20 MED ORDER — MOVIPREP 100 G PO SOLR: 1.0000 | Freq: Once | ORAL | Status: DC

## 2013-02-20 NOTE — Progress Notes (Signed)
No egg or soy allergy. ewm No home 02 use, no cpap use. ewm No e mail at home for the [facility] video. ewm No problems with past sedations. ewm Pt very anxious about needle sticks...states he will pass out with sticks. ewm

## 2013-02-26 ENCOUNTER — Telehealth: Payer: Self-pay | Admitting: Internal Medicine

## 2013-02-26 NOTE — Telephone Encounter (Signed)
Pt's mother had esophageal CA and wants this added to his family hx (not lung CA)

## 2013-03-04 ENCOUNTER — Telehealth: Payer: Self-pay | Admitting: Internal Medicine

## 2013-03-04 NOTE — Telephone Encounter (Signed)
No charge. Thanks.  

## 2013-03-06 ENCOUNTER — Encounter: Payer: Medicare Other | Admitting: Internal Medicine

## 2013-03-16 ENCOUNTER — Encounter: Payer: Self-pay | Admitting: Internal Medicine

## 2013-03-16 ENCOUNTER — Ambulatory Visit (AMBULATORY_SURGERY_CENTER): Payer: Medicare Other | Admitting: Internal Medicine

## 2013-03-16 VITALS — BP 135/49 | HR 84 | Temp 98.3°F | Resp 13 | Ht 73.0 in | Wt 319.0 lb

## 2013-03-16 DIAGNOSIS — D126 Benign neoplasm of colon, unspecified: Secondary | ICD-10-CM

## 2013-03-16 DIAGNOSIS — Z1211 Encounter for screening for malignant neoplasm of colon: Secondary | ICD-10-CM

## 2013-03-16 MED ORDER — SODIUM CHLORIDE 0.9 % IV SOLN: 500.0000 mL | INTRAVENOUS | Status: DC

## 2013-03-16 NOTE — Op Note (Signed)
[place]  [address] [place], [address]   COLONOSCOPY PROCEDURE REPORT  PATIENT: Aaron, Lynch  MR#: [id number] BIRTHDATE: 04/21/1950 , 39  yrs. old GENDER: Male ENDOSCOPIST: Jerene Bears, MD REFERRED AX:KPVVZ Hamrick, M.D. PROCEDURE DATE:  03/16/2013 PROCEDURE:   Colonoscopy with snare polypectomy First Screening Colonoscopy - Avg.  risk and is 50 yrs.  old or older Yes.  Prior Negative Screening - Now for repeat screening. N/A  History of Adenoma - Now for follow-up colonoscopy & has been > or = to 3 yrs.  N/A  Polyps Removed Today? Yes. ASA CLASS:   Class II INDICATIONS:average risk screening and first colonoscopy. MEDICATIONS: MAC sedation, administered by CRNA, Propofol (Diprivan), and Propofol (Diprivan) 470 mg IV  DESCRIPTION OF PROCEDURE:   After the risks benefits and alternatives of the procedure were thoroughly explained, informed consent was obtained.  A digital rectal exam revealed no rectal mass.   The [id number]  endoscope was introduced through the anus and advanced to the cecum, which was identified by both the appendix and ileocecal valve. No adverse events experienced. The quality of the prep was good, using MoviPrep  The instrument was then slowly withdrawn as the colon was fully examined.   COLON FINDINGS: Five sessile polyps measuring 4-6 mm in size were found in the ascending colon, transverse colon (2), descending colon, and rectum.  Polypectomy was performed using cold snare. All resections were complete and all polyp tissue was completely retrieved.   There was mild scattered diverticulosis noted in the descending colon and sigmoid colon.  Retroflexed views revealed no abnormalities. The time to cecum=1 minutes 39 seconds.  Withdrawal time=16 minutes 27 seconds.  The scope was withdrawn and the procedure completed. COMPLICATIONS: There were no complications.  ENDOSCOPIC IMPRESSION: 1.   Five sessile polyps  measuring 4-6 mm in size were found in the ascending colon, transverse colon, descending colon, and rectum; Polypectomy was performed using cold snare 2.   There was mild diverticulosis noted in the descending colon and sigmoid colon  RECOMMENDATIONS: 1.  Await pathology results 2.  High fiber diet 3.  If the polyps removed today are proven to be adenomatous (pre-cancerous) polyps, you will need a colonoscopy in 3 years. Otherwise you should continue to follow colorectal cancer screening guidelines for "routine risk" patients with a colonoscopy in 10 years.  You will receive a letter within 1-2 weeks with the results of your biopsy as well as final recommendations.  Please call my office if you have not received a letter after 3 weeks.   eSigned:  Jerene Bears, MD 03/16/2013 11:04 AM   cc: The Patient and Daiva Eves, MD   PATIENT NAME:  Aaron, Lynch MR#: [id number]

## 2013-03-16 NOTE — Patient Instructions (Signed)
YOU HAD AN ENDOSCOPIC PROCEDURE TODAY AT THE [facility]: Refer to the procedure report that was given to you for any specific questions about what was found during the examination.  If the procedure report does not answer your questions, please call your gastroenterologist to clarify.  If you requested that your care partner not be given the details of your procedure findings, then the procedure report has been included in a sealed envelope for you to review at your convenience later.  YOU SHOULD EXPECT: Some feelings of bloating in the abdomen. Passage of more gas than usual.  Walking can help get rid of the air that was put into your GI tract during the procedure and reduce the bloating. If you had a lower endoscopy (such as a colonoscopy or flexible sigmoidoscopy) you may notice spotting of blood in your stool or on the toilet paper. If you underwent a bowel prep for your procedure, then you may not have a normal bowel movement for a few days.  DIET: Your first meal following the procedure should be a light meal and then it is ok to progress to your normal diet.  A half-sandwich or bowl of soup is an example of a good first meal.  Heavy or fried foods are harder to digest and may make you feel nauseous or bloated.  Likewise meals heavy in dairy and vegetables can cause extra gas to form and this can also increase the bloating.  Drink plenty of fluids but you should avoid alcoholic beverages for 24 hours.  ACTIVITY: Your care partner should take you home directly after the procedure.  You should plan to take it easy, moving slowly for the rest of the day.  You can resume normal activity the day after the procedure however you should NOT DRIVE or use heavy machinery for 24 hours (because of the sedation medicines used during the test).    SYMPTOMS TO REPORT IMMEDIATELY: A gastroenterologist can be reached at any hour.  During normal business hours, 8:30 AM to 5:00 PM Monday through Friday,  call [phone].  After hours and on weekends, please call the GI answering service at [phone] who will take a message and have the physician on call contact you.   Following lower endoscopy (colonoscopy or flexible sigmoidoscopy):  Excessive amounts of blood in the stool  Significant tenderness or worsening of abdominal pains  Swelling of the abdomen that is new, acute  Fever of 100F or higher  FOLLOW UP: If any biopsies were taken you will be contacted by phone or by letter within the next 1-3 weeks.  Call your gastroenterologist if you have not heard about the biopsies in 3 weeks.  Our staff will call the home number listed on your records the next business day following your procedure to check on you and address any questions or concerns that you may have at that time regarding the information given to you following your procedure. This is a courtesy call and so if there is no answer at the home number and we have not heard from you through the emergency physician on call, we will assume that you have returned to your regular daily activities without incident.  SIGNATURES/CONFIDENTIALITY: You and/or your care partner have signed paperwork which will be entered into your electronic medical record.  These signatures attest to the fact that that the information above on your After Visit Summary has been reviewed and is understood.  Full responsibility of the confidentiality of this   discharge information lies with you and/or your care-partner.  Await pathology results  Please continue your normal medications  Please read over handouts about polyps, high fiber diets, and diverticulosis

## 2013-03-16 NOTE — Progress Notes (Signed)
Called to room to assist during endoscopic procedure.  Patient ID and intended procedure confirmed with present staff. Received instructions for my participation in the procedure from the performing physician.  

## 2013-03-16 NOTE — Progress Notes (Signed)
Procedure ends, to recovery, report given and VSS. 

## 2013-03-17 ENCOUNTER — Telehealth: Payer: Self-pay

## 2013-03-17 NOTE — Telephone Encounter (Signed)
  Follow up Call-  Call back number 03/16/2013  Post procedure Call Back phone  # [phone]  Permission to leave phone message Yes     Patient questions:  Do you have a fever, pain , or abdominal swelling? no Pain Score  0 *  Have you tolerated food without any problems? yes  Have you been able to return to your normal activities? yes  Do you have any questions about your discharge instructions: Diet   no Medications  no Follow up visit  no  Do you have questions or concerns about your Care? no  Actions: * If pain score is 4 or above: No action needed, pain <4.

## 2013-03-19 ENCOUNTER — Encounter: Payer: Self-pay | Admitting: Internal Medicine

## 2014-04-21 DIAGNOSIS — M5416 Radiculopathy, lumbar region: Secondary | ICD-10-CM | POA: Diagnosis not present

## 2014-04-21 DIAGNOSIS — M9903 Segmental and somatic dysfunction of lumbar region: Secondary | ICD-10-CM | POA: Diagnosis not present

## 2014-04-26 DIAGNOSIS — M9903 Segmental and somatic dysfunction of lumbar region: Secondary | ICD-10-CM | POA: Diagnosis not present

## 2014-04-26 DIAGNOSIS — M5416 Radiculopathy, lumbar region: Secondary | ICD-10-CM | POA: Diagnosis not present

## 2014-05-04 DIAGNOSIS — M9903 Segmental and somatic dysfunction of lumbar region: Secondary | ICD-10-CM | POA: Diagnosis not present

## 2014-05-04 DIAGNOSIS — M5416 Radiculopathy, lumbar region: Secondary | ICD-10-CM | POA: Diagnosis not present

## 2014-06-09 DIAGNOSIS — M9903 Segmental and somatic dysfunction of lumbar region: Secondary | ICD-10-CM | POA: Diagnosis not present

## 2014-06-09 DIAGNOSIS — M5416 Radiculopathy, lumbar region: Secondary | ICD-10-CM | POA: Diagnosis not present

## 2014-06-16 DIAGNOSIS — M9903 Segmental and somatic dysfunction of lumbar region: Secondary | ICD-10-CM | POA: Diagnosis not present

## 2014-06-16 DIAGNOSIS — M5416 Radiculopathy, lumbar region: Secondary | ICD-10-CM | POA: Diagnosis not present

## 2014-11-03 DIAGNOSIS — L259 Unspecified contact dermatitis, unspecified cause: Secondary | ICD-10-CM | POA: Diagnosis not present

## 2014-11-03 DIAGNOSIS — I8310 Varicose veins of unspecified lower extremity with inflammation: Secondary | ICD-10-CM | POA: Diagnosis not present

## 2014-11-10 DIAGNOSIS — I8310 Varicose veins of unspecified lower extremity with inflammation: Secondary | ICD-10-CM | POA: Diagnosis not present

## 2014-11-10 DIAGNOSIS — B958 Unspecified staphylococcus as the cause of diseases classified elsewhere: Secondary | ICD-10-CM | POA: Diagnosis not present

## 2014-11-22 DIAGNOSIS — J189 Pneumonia, unspecified organism: Secondary | ICD-10-CM | POA: Diagnosis not present

## 2014-11-28 ENCOUNTER — Emergency Department (HOSPITAL_COMMUNITY): Payer: Medicare Other

## 2014-11-28 ENCOUNTER — Encounter (HOSPITAL_COMMUNITY): Payer: Self-pay

## 2014-11-28 ENCOUNTER — Inpatient Hospital Stay (HOSPITAL_COMMUNITY)
Admission: EM | Admit: 2014-11-28 | Discharge: 2014-11-30 | DRG: 176 | Disposition: A | Discharge status: Home / Self Care | Payer: Medicare Other | Attending: Internal Medicine | Admitting: Internal Medicine

## 2014-11-28 DIAGNOSIS — I824Z2 Acute embolism and thrombosis of unspecified deep veins of left distal lower extremity: Secondary | ICD-10-CM | POA: Diagnosis present

## 2014-11-28 DIAGNOSIS — Z720 Tobacco use: Secondary | ICD-10-CM

## 2014-11-28 DIAGNOSIS — R042 Hemoptysis: Secondary | ICD-10-CM | POA: Diagnosis present

## 2014-11-28 DIAGNOSIS — Q631 Lobulated, fused and horseshoe kidney: Secondary | ICD-10-CM | POA: Diagnosis not present

## 2014-11-28 DIAGNOSIS — I82412 Acute embolism and thrombosis of left femoral vein: Secondary | ICD-10-CM | POA: Diagnosis present

## 2014-11-28 DIAGNOSIS — I2699 Other pulmonary embolism without acute cor pulmonale: Secondary | ICD-10-CM | POA: Diagnosis present

## 2014-11-28 DIAGNOSIS — Z79899 Other long term (current) drug therapy: Secondary | ICD-10-CM | POA: Diagnosis not present

## 2014-11-28 DIAGNOSIS — I517 Cardiomegaly: Secondary | ICD-10-CM | POA: Diagnosis present

## 2014-11-28 DIAGNOSIS — R739 Hyperglycemia, unspecified: Secondary | ICD-10-CM | POA: Diagnosis present

## 2014-11-28 DIAGNOSIS — M79669 Pain in unspecified lower leg: Secondary | ICD-10-CM

## 2014-11-28 DIAGNOSIS — Z7982 Long term (current) use of aspirin: Secondary | ICD-10-CM

## 2014-11-28 DIAGNOSIS — M13849 Other specified arthritis, unspecified hand: Secondary | ICD-10-CM | POA: Diagnosis present

## 2014-11-28 DIAGNOSIS — M79662 Pain in left lower leg: Secondary | ICD-10-CM | POA: Diagnosis not present

## 2014-11-28 DIAGNOSIS — R0602 Shortness of breath: Secondary | ICD-10-CM | POA: Diagnosis not present

## 2014-11-28 DIAGNOSIS — R079 Chest pain, unspecified: Secondary | ICD-10-CM | POA: Diagnosis not present

## 2014-11-28 LAB — CBC WITH DIFFERENTIAL/PLATELET
Basophils Absolute: 0 10*3/uL (ref 0.0–0.1)
Basophils Relative: 0 %
Eosinophils Absolute: 0.6 10*3/uL (ref 0.0–0.7)
Eosinophils Relative: 6 %
HEMATOCRIT: 44.9 % (ref 39.0–52.0)
HEMOGLOBIN: 15.7 g/dL (ref 13.0–17.0)
LYMPHS ABS: 1.9 10*3/uL (ref 0.7–4.0)
Lymphocytes Relative: 19 %
MCH: 33.1 pg (ref 26.0–34.0)
MCHC: 35 g/dL (ref 30.0–36.0)
MCV: 94.7 fL (ref 78.0–100.0)
MONOS PCT: 10 %
Monocytes Absolute: 1.1 10*3/uL — ABNORMAL HIGH (ref 0.1–1.0)
NEUTROS ABS: 6.7 10*3/uL (ref 1.7–7.7)
Neutrophils Relative %: 65 %
Platelets: 169 10*3/uL (ref 150–400)
RBC: 4.74 MIL/uL (ref 4.22–5.81)
RDW: 11.4 % — ABNORMAL LOW (ref 11.5–15.5)
WBC: 10.3 10*3/uL (ref 4.0–10.5)

## 2014-11-28 LAB — COMPREHENSIVE METABOLIC PANEL
ALBUMIN: 2.9 g/dL — AB (ref 3.5–5.0)
ALK PHOS: 72 U/L (ref 38–126)
ALT: 24 U/L (ref 17–63)
ANION GAP: 8 (ref 5–15)
AST: 23 U/L (ref 15–41)
BILIRUBIN TOTAL: 1.6 mg/dL — AB (ref 0.3–1.2)
BUN: 6 mg/dL (ref 6–20)
CALCIUM: 8.7 mg/dL — AB (ref 8.9–10.3)
CO2: 26 mmol/L (ref 22–32)
CREATININE: 0.78 mg/dL (ref 0.61–1.24)
Chloride: 100 mmol/L — ABNORMAL LOW (ref 101–111)
GFR calc non Af Amer: >60 mL/min (ref >60)
GLUCOSE: 111 mg/dL — AB (ref 65–99)
Potassium: 3.7 mmol/L (ref 3.5–5.1)
SODIUM: 134 mmol/L — AB (ref 135–145)
TOTAL PROTEIN: 6.4 g/dL — AB (ref 6.5–8.1)

## 2014-11-28 LAB — TROPONIN I: Troponin I: <0.03 ng/mL (ref <0.031)

## 2014-11-28 LAB — D-DIMER, QUANTITATIVE (NOT AT ARMC): D DIMER QUANT: 9.06 ug{FEU}/mL — AB (ref 0.00–0.48)

## 2014-11-28 LAB — BRAIN NATRIURETIC PEPTIDE: B Natriuretic Peptide: 18.4 pg/mL (ref 0.0–100.0)

## 2014-11-28 MED ORDER — IOHEXOL 350 MG/ML SOLN
100.0000 mL | Freq: Once | INTRAVENOUS | Status: AC | PRN
  Administered 2014-11-28: 100 mL via INTRAVENOUS

## 2014-11-28 MED ORDER — HEPARIN (PORCINE) IN NACL 100-0.45 UNIT/ML-% IJ SOLN
1900.0000 [IU]/h | INTRAMUSCULAR | Status: DC
  Administered 2014-11-28 – 2014-11-30 (×3): 1900 [IU]/h via INTRAVENOUS
  Filled 2014-11-28 (×5): qty 250

## 2014-11-28 MED ORDER — HEPARIN BOLUS VIA INFUSION
5000.0000 [IU] | Freq: Once | INTRAVENOUS | Status: AC
  Administered 2014-11-28: 5000 [IU] via INTRAVENOUS
  Filled 2014-11-28: qty 5000

## 2014-11-28 NOTE — ED Notes (Signed)
Onset 1.5 weeks ago right rib pan, cough and has coughed up bright red blood mixed in sputum.  Went to u/c 11-22-14 and was dx with pneumonia by exam, chest xray negative.  Started on levaquin with no improvement in cough. NO respiratory distress, resp e/u.

## 2014-11-28 NOTE — Progress Notes (Signed)
ANTICOAGULATION CONSULT NOTE - Initial Consult  Pharmacy Consult for heparin Indication: pulmonary embolus  Allergies  Allergen Reactions  . Sulfa Antibiotics Hives    Patient Measurements: Height: 6\' 1"  (185.4 cm) Weight: (!) 309 lb 8 oz (140.388 kg) IBW/kg (Calculated) : 79.9 Heparin Dosing Weight: 112 kg  Vital Signs: Temp: 98.3 F (36.8 C) (11/13 1631) Temp Source: Oral (11/13 1631) BP: 143/86 mmHg (11/13 2000) Pulse Rate: 88 (11/13 2000)  Labs:  Recent Labs  11/28/14 1859  HGB 15.7  HCT 44.9  PLT 169  CREATININE 0.78  TROPONINI <0.03    Estimated Creatinine Clearance: 137.4 mL/min (by C-G formula based on Cr of 0.78).   Medical History: Past Medical History  Diagnosis Date  . Arthritis     thumbs    Medications:  See electronic med list  Assessment: 64 y/o male with rib pain and cough with blood who was diagnosed with PNA and given Levaquin over a week ago. He had no improvement in symptoms and returns to the ED. CTA chest reveals PE. Pharmacy consulted to begin IV heparin. No overt bleeding noted, CBC is normal.  Goal of Therapy:  Heparin level 0.3-0.7 units/ml Monitor platelets by anticoagulation protocol: Yes   Plan:  - Heparin 5000 units IV bolus then 1900 units/hr - Heparin level in am - Daily heparin level and CBC - Monitor for s/sx of bleeding  [facility], Farrell.D., BCPS Clinical Pharmacist Pager: [phone] 11/28/2014 9:23 PM

## 2014-11-28 NOTE — ED Notes (Signed)
Ordered meds from pharmacy

## 2014-11-28 NOTE — ED Provider Notes (Signed)
CSN: [id number]     Arrival date & time 11/28/14  1553 History   First MD Initiated Contact with Patient 11/28/14 1751     Chief Complaint  Patient presents with  . Chest Pain    rib pain  . Cough     (Consider location/radiation/quality/duration/timing/severity/associated sxs/prior Treatment) HPI Patient presents with concern of chest pain, cough, dyspnea, hemoptysis. Symptoms began about one week ago, initially with cough, rib pain. Patient was seen in early illness, diagnosed with pneumonia, clinically, though x-ray was nondiagnostic. Patient has been taking Levaquin for 1 week. He notes that he has worsening dyspnea, persistent hemoptysis, ongoing pain throughout the right hemithorax. There is no new nausea, vomiting, diarrhea. There is no anorexia over the past week. Patient is not a smoker, drinks daily, previously used chewing tobacco. Patient retired, is a former Psychologist, sport and exercise.  Past Medical History  Diagnosis Date  . Arthritis     thumbs   Past Surgical History  Procedure Laterality Date  . Other surgical history      partial amputation left great toe and part of foot from injury  . Incision and drainage perirectal abscess N/A 07/28/2012    Procedure: IRRIGATION AND DEBRIDEMENT GLUTEAL ABCESS;  Surgeon: Merrie Roof, MD;  Location: [facility];  Service: General;  Laterality: N/A;   Family History  Problem Relation Age of Onset  . Esophageal cancer Mother   . Colon cancer Neg Hx    Social History  Substance Use Topics  . Smoking status: Never Smoker   . Smokeless tobacco: Former Systems developer    Types: Chew     Comment: quit 25-30 years ago  . Alcohol Use: 14.4 oz/week    24 Cans of beer per week    Review of Systems  Constitutional:       Per HPI, otherwise negative  HENT:       Per HPI, otherwise negative  Respiratory:       Per HPI, otherwise negative  Cardiovascular:       Per HPI, otherwise negative  Gastrointestinal: Negative for vomiting.  Endocrine:        Negative aside from HPI  Genitourinary:       Neg aside from HPI   Musculoskeletal:       Per HPI, otherwise negative  Skin: Negative.   Neurological: Negative for syncope.      Allergies  Sulfa antibiotics  Home Medications   Prior to Admission medications   Medication Sig Start Date End Date Taking? Authorizing Provider  fluocinonide ointment (LIDEX) [id number] % Apply 1 application topically 2 (two) times daily as needed (rash).  11/03/14  Yes Historical Provider, MD  levofloxacin (LEVAQUIN) 750 MG tablet Take 375 mg by mouth 2 (two) times daily. 10 day course started 11/22/14 11/22/14  Yes Historical Provider, MD  aspirin EC 81 MG tablet Take 81 mg by mouth daily.    Historical Provider, MD  Cyanocobalamin (VITAMIN B-12 PO) Take 1 tablet by mouth at bedtime.    Historical Provider, MD  Multiple Vitamin (MULTIVITAMIN WITH MINERALS) TABS tablet Take 1 tablet by mouth at bedtime.    Historical Provider, MD   BP 143/86 mmHg  Pulse 88  Temp(Src) 98.3 F (36.8 C) (Oral)  Resp 15  Ht 6\' 1"  (1.854 m)  Wt 309 lb 8 oz (140.388 kg)  BMI 40.84 kg/m2  SpO2 97% Physical Exam  Constitutional: He is oriented to person, place, and time. He appears well-developed. No distress.  HENT:  Head:  Normocephalic and atraumatic.  Eyes: Conjunctivae and EOM are normal.  Cardiovascular: Normal rate and regular rhythm.   Pulmonary/Chest: Effort normal. No stridor. No respiratory distress.  Abdominal: He exhibits no distension.  Musculoskeletal: He exhibits no edema.  Neurological: He is alert and oriented to person, place, and time.  Skin: Skin is warm and dry.  Psychiatric: He has a normal mood and affect.  Nursing note and vitals reviewed.   ED Course  Procedures (including critical care time) Labs Review Labs Reviewed  COMPREHENSIVE METABOLIC PANEL - Abnormal; Notable for the following:    Sodium 134 (*)    Chloride 100 (*)    Glucose, Bld 111 (*)    Calcium 8.7 (*)    Total Protein 6.4 (*)     Albumin 2.9 (*)    Total Bilirubin 1.6 (*)    All other components within normal limits  CBC WITH DIFFERENTIAL/PLATELET - Abnormal; Notable for the following:    RDW 11.4 (*)    Monocytes Absolute 1.1 (*)    All other components within normal limits  D-DIMER, QUANTITATIVE (NOT AT [facility]) - Abnormal; Notable for the following:    D-Dimer, Quant 9.06 (*)    All other components within normal limits  TROPONIN I  BRAIN NATRIURETIC PEPTIDE    Imaging Review Dg Chest 2 View  11/28/2014  CLINICAL DATA:  Acute onset of severe shortness of breath and right-sided chest pain. Hemoptysis and wheezing. Initial encounter. EXAM: CHEST  2 VIEW COMPARISON:  None. FINDINGS: The lungs are well-aerated. Peribronchial thickening is noted. There is no evidence of focal opacification, pleural effusion or pneumothorax. The heart is borderline normal in size. No acute osseous abnormalities are seen. IMPRESSION: Peribronchial thickening noted.  Lungs otherwise clear. Electronically Signed   By: Garald Balding M.D.   On: 11/28/2014 19:55   Ct Angio Chest Pe W/cm &/or Wo Cm  11/28/2014  CLINICAL DATA:  Right rib pain with cough and hemoptysis. Started on Levaquin with no improvement. EXAM: CT ANGIOGRAPHY CHEST WITH CONTRAST TECHNIQUE: Multidetector CT imaging of the chest was performed using the standard protocol during bolus administration of intravenous contrast. Multiplanar CT image reconstructions and MIPs were obtained to evaluate the vascular anatomy. CONTRAST:  146mL OMNIPAQUE IOHEXOL 350 MG/ML SOLN COMPARISON:  None. FINDINGS: The lungs are adequately inflated with focal opacification over the posterior medial right lower lobe likely infarction. Tiny amount of right pleural fluid. Airways are within normal. There is mild cardiomegaly. Subtle calcification over the left anterior descending coronary artery. There is evidence of pulmonary emboli within the distal right main pulmonary artery extending into the  proximal right lower lobar and middle lobar arteries. RV/LV ratio is within normal. There is mild calcified plaque over the thoracic aorta. No evidence of hilar, mediastinal or axillary adenopathy. Remaining mediastinal structures are within normal. Images through the upper abdomen demonstrate no focal abnormality. There are mild degenerate changes of the spine. Review of the MIP images confirms the above findings. IMPRESSION: Mild to moderate burden a pulmonary emboli over the right main, middle and lower lobar arteries. No evidence of right heart strain. Infarct over the posterior medial right lower lobe. Small right effusion. Mild cardiomegaly. Critical Value/emergent results were called by telephone at the time of interpretation on 11/28/2014 at 8:59 pm to Dr. Carmin Muskrat , who verbally acknowledged these results. Electronically Signed   By: Marin Olp M.D.   On: 11/28/2014 20:59   I have personally reviewed and evaluated these images and lab  results as part of my medical decision-making.   EKG Interpretation   Date/Time:  Sunday November 28 2014 19:01:34 EST Ventricular Rate:  88 PR Interval:  160 QRS Duration: 130 QT Interval:  391 QTC Calculation: 473 R Axis:   -82 Text Interpretation:  Sinus rhythm Nonspecific IVCD with LAD Sinus rhythm  Non-specific intra-ventricular conduction delay Artifact Abnormal ekg  Confirmed by Carmin Muskrat  MD [phone]) on 11/28/2014 7:05:15 PM     On repeat exam the patient has sample of bloody sputum for demonstration. No new complaints Vital signs remain similar.  Update: I discussed patient's CT findings with our radiologist, subsequent repeat evaluation the patient was performed. Patient remains in similar condition, with active production of bloody sputum. We discussed all findings, need for admission, evaluation for hypercoagulable state. Patient was started on a heparin drip. After his questionable findings, admission, indication for  medication, the patient's daughter states that the patient had minor trauma about 2 weeks ago, in the days prior to the onset of symptoms. She states that the patient had trauma to his calf, subsequent had a notable ecchymotic, edematous patch.  MDM   Final diagnoses:  Pulmonary embolism and infarction [facility])   patient presents with ongoing chest pain, dyspnea, is found to have elevated d-dimer, subsequently found to have pulmonary embolism with infarct. Patient has mild tachypnea, mild hypoxia, and with the aforementioned findings, required initiation of heparin drip, admission for further evaluation and management.   CRITICAL CARE Performed by: Carmin Muskrat Total critical care time: 35 minutes Critical care time was exclusive of separately billable procedures and treating other patients. Critical care was necessary to treat or prevent imminent or life-threatening deterioration. Critical care was time spent personally by me on the following activities: development of treatment plan with patient and/or surrogate as well as nursing, discussions with consultants, evaluation of patient's response to treatment, examination of patient, obtaining history from patient or surrogate, ordering and performing treatments and interventions, ordering and review of laboratory studies, ordering and review of radiographic studies, pulse oximetry and re-evaluation of patient's condition.   Carmin Muskrat, MD 11/28/14 [id number]

## 2014-11-28 NOTE — ED Notes (Signed)
Attempted report 

## 2014-11-29 ENCOUNTER — Inpatient Hospital Stay (HOSPITAL_COMMUNITY): Payer: Medicare Other

## 2014-11-29 DIAGNOSIS — M79662 Pain in left lower leg: Secondary | ICD-10-CM | POA: Diagnosis present

## 2014-11-29 DIAGNOSIS — M79669 Pain in unspecified lower leg: Secondary | ICD-10-CM

## 2014-11-29 DIAGNOSIS — Z79899 Other long term (current) drug therapy: Secondary | ICD-10-CM

## 2014-11-29 DIAGNOSIS — R042 Hemoptysis: Secondary | ICD-10-CM

## 2014-11-29 DIAGNOSIS — Q631 Lobulated, fused and horseshoe kidney: Secondary | ICD-10-CM

## 2014-11-29 DIAGNOSIS — I2699 Other pulmonary embolism without acute cor pulmonale: Secondary | ICD-10-CM

## 2014-11-29 LAB — CBC WITH DIFFERENTIAL/PLATELET
BASOS ABS: 0 10*3/uL (ref 0.0–0.1)
Basophils Relative: 0 %
Eosinophils Absolute: 0.8 10*3/uL — ABNORMAL HIGH (ref 0.0–0.7)
Eosinophils Relative: 8 %
HEMATOCRIT: 45 % (ref 39.0–52.0)
HEMOGLOBIN: 15.4 g/dL (ref 13.0–17.0)
LYMPHS PCT: 25 %
Lymphs Abs: 2.4 10*3/uL (ref 0.7–4.0)
MCH: 33 pg (ref 26.0–34.0)
MCHC: 34.2 g/dL (ref 30.0–36.0)
MCV: 96.6 fL (ref 78.0–100.0)
MONO ABS: 1.1 10*3/uL — AB (ref 0.1–1.0)
MONOS PCT: 11 %
NEUTROS ABS: 5.4 10*3/uL (ref 1.7–7.7)
NEUTROS PCT: 56 %
Platelets: 154 10*3/uL (ref 150–400)
RBC: 4.66 MIL/uL (ref 4.22–5.81)
RDW: 11.7 % (ref 11.5–15.5)
WBC: 9.6 10*3/uL (ref 4.0–10.5)

## 2014-11-29 LAB — BASIC METABOLIC PANEL
Anion gap: 8 (ref 5–15)
BUN: 6 mg/dL (ref 6–20)
CO2: 27 mmol/L (ref 22–32)
Calcium: 8.7 mg/dL — ABNORMAL LOW (ref 8.9–10.3)
Chloride: 100 mmol/L — ABNORMAL LOW (ref 101–111)
Creatinine, Ser: 0.86 mg/dL (ref 0.61–1.24)
GFR calc Af Amer: >60 mL/min (ref >60)
GFR calc non Af Amer: >60 mL/min (ref >60)
GLUCOSE: 135 mg/dL — AB (ref 65–99)
POTASSIUM: 4.1 mmol/L (ref 3.5–5.1)
SODIUM: 135 mmol/L (ref 135–145)

## 2014-11-29 LAB — HEPARIN LEVEL (UNFRACTIONATED)
Heparin Unfractionated: 0.4 IU/mL (ref 0.30–0.70)
Heparin Unfractionated: 0.6 IU/mL (ref 0.30–0.70)

## 2014-11-29 LAB — ANTITHROMBIN III: ANTITHROMB III FUNC: 70 % — AB (ref 75–120)

## 2014-11-29 LAB — MRSA PCR SCREENING: MRSA BY PCR: NEGATIVE

## 2014-11-29 MED ORDER — ACETAMINOPHEN 325 MG PO TABS: 650.0000 mg | ORAL_TABLET | Freq: Four times a day (QID) | ORAL | Status: DC | PRN

## 2014-11-29 MED ORDER — HYDROCODONE-ACETAMINOPHEN 5-325 MG PO TABS: 1.0000 | ORAL_TABLET | ORAL | Status: DC | PRN

## 2014-11-29 MED ORDER — ACETAMINOPHEN 650 MG RE SUPP: 650.0000 mg | Freq: Four times a day (QID) | RECTAL | Status: DC | PRN

## 2014-11-29 MED ORDER — IPRATROPIUM-ALBUTEROL 0.5-2.5 (3) MG/3ML IN SOLN: 3.0000 mL | Freq: Four times a day (QID) | RESPIRATORY_TRACT | Status: DC | PRN

## 2014-11-29 MED ORDER — FLUOCINONIDE 0.05 % EX OINT: 1.0000 "application " | TOPICAL_OINTMENT | Freq: Two times a day (BID) | CUTANEOUS | Status: DC | PRN

## 2014-11-29 MED ORDER — SODIUM CHLORIDE 0.9 % IJ SOLN: 3.0000 mL | Freq: Two times a day (BID) | INTRAMUSCULAR | Status: DC

## 2014-11-29 MED ORDER — OXYCODONE HCL 5 MG PO TABS: 5.0000 mg | ORAL_TABLET | ORAL | Status: DC | PRN

## 2014-11-29 MED ORDER — PERFLUTREN LIPID MICROSPHERE
1.0000 mL | INTRAVENOUS | Status: DC | PRN
  Administered 2014-11-29: 4 mL via INTRAVENOUS
  Filled 2014-11-29: qty 10

## 2014-11-29 MED ORDER — COUMADIN BOOK
Freq: Once | Status: DC
  Filled 2014-11-29: qty 1

## 2014-11-29 MED ORDER — HYDROCODONE-ACETAMINOPHEN 5-325 MG PO TABS
1.0000 | ORAL_TABLET | Freq: Four times a day (QID) | ORAL | Status: DC | PRN
Start: 2014-11-29 — End: 2014-11-29

## 2014-11-29 NOTE — Care Management Note (Signed)
Case Management Note  Patient Details  Name: Aaron Lynch MRN: [id number] Date of Birth: 1950-02-27  Subjective/Objective:   Pulmonary Embolism                 Action/Plan: Benefits check complete. Will follow up with attending on medication initiated for home.    S/W [name] @ [facility] # [phone]   1. ELIQUIS 10 MG BID FOR 7 DAYS  THEN   5 MG PO BID   COVER- YES  CO-PAY- $ 40.00                          $ 40.00  TIER- 3 DRUG                             SAME  PRIOR APPROVAL - NO                     NO   2. XARELTO 15 MG BID FOR 21 DAYS         20 MG QD   COVER- YES  CO-PAY- $ 25.00                            $25.00  TIER- 2 DRUG                              SAME  PRIOR APPROVAL -NO                      NO   3. PRADAXA 150 MG PO BID   COVER- YES  CO-PAY- $ 25.00  TIER- 2 DRUG  PRIOR APPROVAL - NO   PHARMACY : [facility]  AND [place] [facility]   Expected Discharge Date:  12/01/2014               Expected Discharge Plan:  Home/Self Care  In-House Referral:     Discharge planning Services  CM Consult   Status of Service:  Completed, signed off  Medicare Important Message Given:    Date Medicare IM Given:    Medicare IM give by:    Date Additional Medicare IM Given:    Additional Medicare Important Message give by:     If discussed at [place] of Stay Meetings, dates discussed:    Additional Comments:  Erenest Rasher, RN 11/29/2014, 4:50 PM

## 2014-11-29 NOTE — Progress Notes (Signed)
Pt to [facility] to 2W-21, VSS, called report. Family present & aware of [place].

## 2014-11-29 NOTE — Progress Notes (Signed)
[place] for heparin Indication: pulmonary embolus  Allergies  Allergen Reactions  . Sulfa Antibiotics Hives    Patient Measurements: Height: 6\' 1"  (185.4 cm) Weight: (!) 309 lb 8 oz (140.388 kg) IBW/kg (Calculated) : 79.9 Heparin Dosing Weight: 112 kg  Vital Signs: Temp: 97.9 F (36.6 C) (11/14 0316) Temp Source: Oral (11/14 0316) BP: 156/90 mmHg (11/14 0010) Pulse Rate: 90 (11/13 2330)  Labs:  Recent Labs  11/28/14 1859 11/29/14 0339  HGB 15.7 15.4  HCT 44.9 45.0  PLT 169 154  HEPARINUNFRC  --  0.40  CREATININE 0.78 0.86  TROPONINI <0.03  --     Estimated Creatinine Clearance: 127.8 mL/min (by C-G formula based on Cr of 0.86).  Assessment: 64 y.o. male with PE for heparin  Goal of Therapy:  Heparin level 0.3-0.7 units/ml Monitor platelets by anticoagulation protocol: Yes   Plan:  Continue Heparin at current rate for now Recheck level in 6 hrs to verify  Phillis Knack, PharmD, BCPS   11/29/2014 4:14 AM

## 2014-11-29 NOTE — Progress Notes (Signed)
*  Preliminary Results* Bilateral lower extremity venous duplex completed. The right lower extremity is negative for deep vein thrombosis. The left lower extremity is positive for deep vein thrombosis involving the left common femoral, femoral, and popliteal veins. Unable to visualized the left posterior tibial and peroneal veins, therefore unable to exclude deep vein thrombosis in these vessels.  Preliminary results discussed with Maggie, RN, and Dr. Thereasa Solo.  11/29/2014  Maudry Mayhew, RVT, RDCS, RDMS

## 2014-11-29 NOTE — Progress Notes (Signed)
  Echocardiogram 2D Echocardiogram with Definity has been performed.  Aaron Lynch M 11/29/2014, 1:17 PM

## 2014-11-29 NOTE — H&P (Addendum)
[facility] History and Physical  Aaron Lynch [id number] DOB: 18-May-1950 DOA: 11/28/2014  Referring physician: ED PCP: Leonides Sake, MD   Chief Complaint: "Hurting in my right side and coughing up blood"  HPI:  Patient is a 64 year old male with a past medical history horse-shoe kidney previously relatively healthy; who presents with right-sided chest pain and cough. Pain initially started a week or so ago. Describes pain as sharp in nature worsened with deep breaths in. Associated symptoms included cough with intermittent hemoptysis. He went to urgent care 6 days ago and was told that they thought he may have a pneumonia. He was given a Rocephin shot, levofloxacin 750 mg, and prednisone.  He felt better for a day or so, but he continued to cough up blood and decided he needed further evaluation. He also notes that he has had some left-sided calf pain. Patient denies having any recent sick contacts, fever, chills. 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 Patient denies any recent travel, prolonged sitting over 4 hours, weight loss, known malignancy, family history of bleeding or clotting disorders. Patient notes that he seen a dermatologist but has never had a full body exam. He notes a tractor accident in which he got ran over by a tractor and caused injury to his left foot and leg back in 1999. He suffers from intermittent rash on the left foot that was previously treated with cephalexin and ointment. Family was present at bedside including wife and daughter in discussing with the patient about anticoagulation. Mr. Ferrand states that he does not do well with needles and would not like to give himself shots.   Review of Systems  Constitutional: Negative for fever and chills.  HENT: Negative for hearing loss and tinnitus.   Eyes: Negative for blurred vision and photophobia.  Respiratory: Positive for cough, hemoptysis and  shortness of breath. Negative for wheezing.   Cardiovascular: Positive for leg swelling. Negative for chest pain.  Gastrointestinal: Negative for nausea and vomiting.  Genitourinary: Negative for urgency and frequency.  Musculoskeletal: Negative for myalgias and neck pain.  Skin: Positive for rash. Negative for itching.  Neurological: Negative for dizziness and tingling.  Endo/Heme/Allergies: Negative for environmental allergies. Does not bruise/bleed easily.  Psychiatric/Behavioral: Negative for depression and suicidal ideas.      Past Medical History  Diagnosis Date  . Arthritis     thumbs     Past Surgical History  Procedure Laterality Date  . Other surgical history      partial amputation left great toe and part of foot from injury  . Incision and drainage perirectal abscess N/A 07/28/2012    Procedure: IRRIGATION AND DEBRIDEMENT GLUTEAL ABCESS;  Surgeon: Merrie Roof, MD;  Location: [facility];  Service: General;  Laterality: N/A;      Social History:  reports that he has never smoked. He has quit using smokeless tobacco. His smokeless tobacco use included Chew. He reports that he drinks about 14.4 oz of alcohol per week. He reports that he does not use illicit drugs.  where does patient live--home  and with whom if at home?Wife  Can patient participate in ADLs?Yes    Allergies  Allergen Reactions  . Sulfa Antibiotics Hives    Family History  Problem Relation Age of Onset  . Esophageal cancer Mother   . Colon cancer Neg Hx        Prior to Admission medications  Medication Sig Start Date End Date Taking? Authorizing Provider  fluocinonide ointment (LIDEX) [id number] % Apply 1 application topically 2 (two) times daily as needed (rash).  11/03/14  Yes Historical Provider, MD  levofloxacin (LEVAQUIN) 750 MG tablet Take 375 mg by mouth 2 (two) times daily. 10 day course started 11/22/14 11/22/14  Yes Historical Provider, MD  aspirin EC 81 MG tablet Take 81 mg by mouth daily.     Historical Provider, MD  Cyanocobalamin (VITAMIN B-12 PO) Take 1 tablet by mouth at bedtime.    Historical Provider, MD  Multiple Vitamin (MULTIVITAMIN WITH MINERALS) TABS tablet Take 1 tablet by mouth at bedtime.    Historical Provider, MD     Physical Exam: Filed Vitals:   11/28/14 2200 11/28/14 2230 11/28/14 2300 11/28/14 2330  BP: 139/82 132/82 145/87 151/83  Pulse: 91 83 95 90  Temp:      TempSrc:      Resp: 20 27 18 24   Height:      Weight:      SpO2: 92% 93% 93% 94%     Constitutional: Vital signs reviewed. Patient is a obese male in no acute distress and cooperative with exam. Alert and oriented x3.  Head: Normocephalic and atraumatic  Ear: TM normal bilaterally  Mouth: no erythema or exudates, MMM  Eyes: PERRL, EOMI, conjunctivae normal, No scleral icterus.  Neck: Supple, Trachea midline normal ROM, No JVD, mass, thyromegaly, or carotid bruit present.  Cardiovascular: RRR, S1 normal, S2 normal, no MRG, pulses symmetric and intact bilaterally  Pulmonary/Chest: CTAB, no wheezes, rales, or rhonchi. Patient complains of pain with deep inspiration on posterior aspect of the right lower lung field. Patient intermittently coughs up blood-tinged sputum. Abdominal: Soft. Non-tender, non-distended, bowel sounds are normal, no masses, organomegaly, or guarding present.  GU: no CVA tenderness Musculoskeletal: No joint deformities, erythema, or stiffness, ROM full and no nontender Ext: +1 edema of the left lower extremity with tenderness to palpation. Homans sign appears positive. No cyanosis, pulses palpable bilaterally (DP and PT)  Hematology: no cervical, inginal, or axillary adenopathy.  Neurological: A&O x3, Strenght is normal and symmetric bilaterally, cranial nerve II-XII are grossly intact, no focal motor deficit, sensory intact to light touch bilaterally.  Skin: Nancy Fetter aged but clear suspicious lesions observed Warm, dry and intact. No rash, cyanosis, or clubbing.  Psychiatric:  Normal mood and affect. speech and behavior is normal. Judgment and thought content normal. Cognition and memory are normal.      Data Review   Micro Results No results found for this or any previous visit (from the past 240 hour(s)).  Radiology Reports Dg Chest 2 View  11/28/2014  CLINICAL DATA:  Acute onset of severe shortness of breath and right-sided chest pain. Hemoptysis and wheezing. Initial encounter. EXAM: CHEST  2 VIEW COMPARISON:  None. FINDINGS: The lungs are well-aerated. Peribronchial thickening is noted. There is no evidence of focal opacification, pleural effusion or pneumothorax. The heart is borderline normal in size. No acute osseous abnormalities are seen. IMPRESSION: Peribronchial thickening noted.  Lungs otherwise clear. Electronically Signed   By: Garald Balding M.D.   On: 11/28/2014 19:55   Ct Angio Chest Pe W/cm &/or Wo Cm  11/28/2014  CLINICAL DATA:  Right rib pain with cough and hemoptysis. Started on Levaquin with no improvement. EXAM: CT ANGIOGRAPHY CHEST WITH CONTRAST TECHNIQUE: Multidetector CT imaging of the chest was performed using the standard protocol during bolus administration of intravenous contrast. Multiplanar CT image reconstructions and MIPs were  obtained to evaluate the vascular anatomy. CONTRAST:  152mL OMNIPAQUE IOHEXOL 350 MG/ML SOLN COMPARISON:  None. FINDINGS: The lungs are adequately inflated with focal opacification over the posterior medial right lower lobe likely infarction. Tiny amount of right pleural fluid. Airways are within normal. There is mild cardiomegaly. Subtle calcification over the left anterior descending coronary artery. There is evidence of pulmonary emboli within the distal right main pulmonary artery extending into the proximal right lower lobar and middle lobar arteries. RV/LV ratio is within normal. There is mild calcified plaque over the thoracic aorta. No evidence of hilar, mediastinal or axillary adenopathy. Remaining  mediastinal structures are within normal. Images through the upper abdomen demonstrate no focal abnormality. There are mild degenerate changes of the spine. Review of the MIP images confirms the above findings. IMPRESSION: Mild to moderate burden a pulmonary emboli over the right main, middle and lower lobar arteries. No evidence of right heart strain. Infarct over the posterior medial right lower lobe. Small right effusion. Mild cardiomegaly. Critical Value/emergent results were called by telephone at the time of interpretation on 11/28/2014 at 8:59 pm to Dr. Carmin Muskrat , who verbally acknowledged these results. Electronically Signed   By: Marin Olp M.D.   On: 11/28/2014 20:59     CBC  Recent Labs Lab 11/28/14 1859  WBC 10.3  HGB 15.7  HCT 44.9  PLT 169  MCV 94.7  MCH 33.1  MCHC 35.0  RDW 11.4*  LYMPHSABS 1.9  MONOABS 1.1*  EOSABS 0.6  BASOSABS 0.0    Chemistries   Recent Labs Lab 11/28/14 1859  NA 134*  K 3.7  CL 100*  CO2 26  GLUCOSE 111*  BUN 6  CREATININE 0.78  CALCIUM 8.7*  AST 23  ALT 24  ALKPHOS 72  BILITOT 1.6*   ------------------------------------------------------------------------------------------------------------------ estimated creatinine clearance is 137.4 mL/min (by C-G formula based on Cr of 0.78). ------------------------------------------------------------------------------------------------------------------ No results for input(s): HGBA1C in the last 72 hours. ------------------------------------------------------------------------------------------------------------------ No results for input(s): CHOL, HDL, LDLCALC, TRIG, CHOLHDL, LDLDIRECT in the last 72 hours. ------------------------------------------------------------------------------------------------------------------ No results for input(s): TSH, T4TOTAL, T3FREE, THYROIDAB in the last 72 hours.  Invalid input(s):  FREET3 ------------------------------------------------------------------------------------------------------------------ No results for input(s): VITAMINB12, FOLATE, FERRITIN, TIBC, IRON, RETICCTPCT in the last 72 hours.  Coagulation profile No results for input(s): INR, PROTIME in the last 168 hours.   Recent Labs  11/28/14 1859  DDIMER 9.06*    Cardiac Enzymes  Recent Labs Lab 11/28/14 1859  TROPONINI <0.03   ------------------------------------------------------------------------------------------------------------------ Invalid input(s): POCBNP   CBG: No results for input(s): GLUCAP in the last 168 hours.     EKG: Independently reviewed. Sinus rhythm with artifact  Assessment/Plan Active Problems:   Pulmonary embolism and infarction (HCC)   Pain of left calf   Horseshoe kidney   Hemoptysis Pulmonary embolism with infarction: Acute. Patient with no clear risk factors per history. Question tractor accident from 1999 or the patient with horseshoe kidney that makes his anatomy susceptible to clots. Although no previous history of such events in the past. CT pulmonary infarction on right distal pulmonary artery. No signs of right heart strain.  -Stepdown unit for continuous telemetry and pulse oximetry monitoring -Checking hypercoagulable workup -Checking echocardiogram in a.m. -discussed with patient and family possible anticoagulation options including Coumadin versus newer agents. -Coumadin book ordered -Heparin per pharmacy -Will need further discussions on which anticoagulation is best for the patient -Would also recommend a full body dermatology  eval as he works as a Psychologist, sport and exercise  Hemoptysis: secondary  to above. H&H appears stable. -cbc for recheck in a.m. as patient is on heparin   Pain in left calf pain: Acute. Appeared similar time as right side pain that was secondary to acute PE - A check of venous Doppler ultrasound of the bilateral lower extremities to  determine if there is a DVT as well   Horseshoe kidney: stable patient with normal kidney function   Cardiomegaly seen on CT -Echo order as seen above  Code Status:   full Family Communication: bedside Disposition Plan: admit   Total time spent 55 minutes.Greater than 50% of this time was spent in counseling, explanation of diagnosis, planning of further management, and coordination of care  [facility] Pager [phone]  If 7PM-7AM, please contact night-coverage [email or web address] Password [email or web address] 11/29/2014, 12:01 AM

## 2014-11-29 NOTE — Progress Notes (Signed)
[facility] TEAM 1 - Stepdown/ICU TEAM PROGRESS NOTE  Aaron Lynch [id number] DOB: Feb 10, 1950 DOA: 11/28/2014 PCP: Leonides Sake, MD  Admit HPI / Brief Narrative: 64 year old male with a hx fo horse-shoe kidney who presented with a week long hx of  right-sided chest pain and cough associated w/ intermittent hemoptysis. He went to an urgent care and was told he had a pneumonia. He was given a Rocephin shot, levofloxacin, and prednisone. He continued to cough up blood and decided he needed further evaluation. 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 Patient denies any recent travel, prolonged sitting over 4 hours, weight loss, known malignancy, family history of bleeding or clotting disorders.  HPI/Subjective: Pt seen for f/u visit.  Assessment/Plan:  Pulmonary embolism with infarction Counseled patient extensively in regards to options for transition to oral therapy - patient to consider options and let us know tomorrow - he is aware that choosing Coumadin or per DEXA will prolong his hospital stay - I have asked the case manager to investigate the co-pay and preauthorization status of the new agents specific to his insurance  Hemoptysis  Pain in left calf pain Venous duplex pending  Horseshoe kidney  Cardiomegaly  Echocardiogram pending  Hyperglycemia Check A1c  Code Status: FULL Family Communication: Spoke with patient and wife at bedside at length Disposition Plan: Stable for transfer to telemetry bed  Consultants: none  Procedures: TTE - pending  B LE venous duplex - pending   Antibiotics: none  DVT prophylaxis: IV heparin   Objective: Blood pressure 125/72, pulse 79, temperature 97.9 F (36.6 C), temperature source Oral, resp. rate 18, height 6\' 1"  (1.854 m), weight 140.388 kg (309 lb 8 oz), SpO2 98 %.  Intake/Output Summary (Last 24 hours) at 11/29/14 1140 Last data filed at 11/29/14 0600  Gross per 24 hour  Intake    133 ml  Output    450 ml  Net   -317 ml   Exam: Pt seen for f/u visit.  Data Reviewed: Basic Metabolic Panel:  Recent Labs Lab 11/28/14 1859 11/29/14 0339  NA 134* 135  K 3.7 4.1  CL 100* 100*  CO2 26 27  GLUCOSE 111* 135*  BUN 6 6  CREATININE 0.78 0.86  CALCIUM 8.7* 8.7*    CBC:  Recent Labs Lab 11/28/14 1859 11/29/14 0339  WBC 10.3 9.6  NEUTROABS 6.7 5.4  HGB 15.7 15.4  HCT 44.9 45.0  MCV 94.7 96.6  PLT 169 154    Liver Function Tests:  Recent Labs Lab 11/28/14 1859  AST 23  ALT 24  ALKPHOS 72  BILITOT 1.6*  PROT 6.4*  ALBUMIN 2.9*    Cardiac Enzymes:  Recent Labs Lab 11/28/14 1859  TROPONINI <0.03    Recent Results (from the past 240 hour(s))  MRSA PCR Screening     Status: None   Collection Time: 11/29/14  3:54 AM  Result Value Ref Range Status   MRSA by PCR NEGATIVE NEGATIVE Final    Comment:        The GeneXpert MRSA Assay (FDA approved for NASAL specimens only), is one component of a comprehensive MRSA colonization surveillance program. It is not intended to diagnose MRSA infection nor to guide or monitor treatment for MRSA infections.      Studies:   Recent x-ray studies have been reviewed in detail by the Attending Physician  Scheduled Meds:  Scheduled Meds: . coumadin book  Does not apply Once  . sodium chloride  3 mL Intravenous Q12H    Time spent on care of this patient: No charge   Cherene Altes , MD   [facility] Office  [phone] Pager - Text Page per Amion as per below:  On-Call/Text Page:      [email or web address]      password [email or web address]  If 7PM-7AM, please contact night-coverage [email or web address] Password [email or web address] 11/29/2014, 11:40 AM   LOS: 1 day

## 2014-11-29 NOTE — Progress Notes (Signed)
[facility] for heparin Indication: pulmonary embolus  Allergies  Allergen Reactions  . Sulfa Antibiotics Hives    Patient Measurements: Height: 6\' 1"  (185.4 cm) Weight: (!) 309 lb 8 oz (140.388 kg) IBW/kg (Calculated) : 79.9 Heparin Dosing Weight: 112 kg  Vital Signs: Temp: 97.9 F (36.6 C) (11/14 0700) Temp Source: Oral (11/14 0700) BP: 125/72 mmHg (11/14 0800) Pulse Rate: 79 (11/14 0800)  Labs:  Recent Labs  11/28/14 1859 11/29/14 0339 11/29/14 0932  HGB 15.7 15.4  --   HCT 44.9 45.0  --   PLT 169 154  --   HEPARINUNFRC  --  0.40 0.60  CREATININE 0.78 0.86  --   TROPONINI <0.03  --   --     Estimated Creatinine Clearance: 127.8 mL/min (by C-G formula based on Cr of 0.86).  Assessment: 47 YOM who presented with right sides chest pain and cough along with hemoptysis. Found to have PE (note he reported left sided calf pain as well).   Started on IV heparin- now therapeutic x2 on 1900 units/hr.  Note patient does not want to give himself shots, so would anticipate transition to a DOAC for PE management.  CBC stable, no bleeding noted.  Goal of Therapy:  Heparin level 0.3-0.7 units/ml Monitor platelets by anticoagulation protocol: Yes   Plan:  -continue heparin at 1900 units/hr -daily HL and CBC -follow plans for long term AC  Carli Lefevers D. Nekia Maxham, PharmD, BCPS Clinical Pharmacist Pager: [phone] 11/29/2014 10:52 AM

## 2014-11-30 DIAGNOSIS — I2699 Other pulmonary embolism without acute cor pulmonale: Principal | ICD-10-CM

## 2014-11-30 LAB — CBC
HEMATOCRIT: 43.3 % (ref 39.0–52.0)
Hemoglobin: 14.8 g/dL (ref 13.0–17.0)
MCH: 33 pg (ref 26.0–34.0)
MCHC: 34.2 g/dL (ref 30.0–36.0)
MCV: 96.4 fL (ref 78.0–100.0)
Platelets: 180 10*3/uL (ref 150–400)
RBC: 4.49 MIL/uL (ref 4.22–5.81)
RDW: 11.6 % (ref 11.5–15.5)
WBC: 8.3 10*3/uL (ref 4.0–10.5)

## 2014-11-30 LAB — HOMOCYSTEINE: Homocysteine: 5.9 umol/L (ref 0.0–15.0)

## 2014-11-30 LAB — HEPARIN LEVEL (UNFRACTIONATED): HEPARIN UNFRACTIONATED: 0.71 [IU]/mL — AB (ref 0.30–0.70)

## 2014-11-30 MED ORDER — ACETAMINOPHEN 325 MG PO TABS: 325.0000 mg | ORAL_TABLET | ORAL | Status: AC | PRN

## 2014-11-30 MED ORDER — APIXABAN 5 MG PO TABS
10.0000 mg | ORAL_TABLET | Freq: Two times a day (BID) | ORAL | Status: DC
  Administered 2014-11-30: 10 mg via ORAL
  Filled 2014-11-30: qty 2

## 2014-11-30 MED ORDER — APIXABAN 5 MG PO TABS: 5.0000 mg | ORAL_TABLET | Freq: Two times a day (BID) | ORAL | Status: DC

## 2014-11-30 MED ORDER — APIXABAN 5 MG PO TABS: ORAL_TABLET | ORAL | Status: DC
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
## 2014-11-30 NOTE — Care Management Note (Signed)
Case Management Note Previous CM note initiated by Jonnie Finner RN CM  Patient Details  Name: Aaron Lynch MRN: [id number] Date of Birth: Jul 16, 1950  Subjective/Objective:   Pulmonary Embolism                 Action/Plan: Benefits check complete. Will follow up with attending on medication initiated for home.    S/W [name] @ [place] RX # [id number]   1. ELIQUIS 10 MG BID FOR 7 DAYS  THEN   5 MG PO BID   COVER- YES  CO-PAY- $ 40.00                          $ 40.00  TIER- 3 DRUG                             SAME  PRIOR APPROVAL - NO                     NO   2. XARELTO 15 MG BID FOR 21 DAYS         20 MG QD   COVER- YES  CO-PAY- $ 25.00                            $25.00  TIER- 2 DRUG                              SAME  PRIOR APPROVAL -NO                      NO   3. PRADAXA 150 MG PO BID   COVER- YES  CO-PAY- $ 25.00  TIER- 2 DRUG  PRIOR APPROVAL - NO   PHARMACY : [facility] [place]   Expected Discharge Date:  11/30/2014               Expected Discharge Plan:  Home/Self Care  In-House Referral:     Discharge planning Services  CM Consult, Medication Assistance   Status of Service:  Completed, signed off  Medicare Important Message Given:    Date Medicare IM Given:    Medicare IM give by:    Date Additional Medicare IM Given:    Additional Medicare Important Message give by:     If discussed at [place] of Stay Meetings, dates discussed:    Additional Comments:  11/30/14- [name] RN, BSN - spoke with pt and wife at bedside- explained insurance coverage for Eliquis vs Xarelto- per conversation pt and wife state that cost is not a factor and that either would be ok with them they would like MD to make selection on which one would be  best for pt- MD arrived at bedside during conversation- and decision was made to go with Eliquis for discharge- 30 day free card was provided to pt and pharmacy will f/u with pt for education on Eliquis. [facility] to [facility] in [place] who currently does not have drug in-stock but can order- will have pt use [facility] outpt pharmacy to fill initial script- directions/address/phone # given for the [facility]- pt and wife understand and voice agreement in using [facility] pharmacy for 30 day free fill.   Dawayne Patricia, RN 11/30/2014, 10:40 AM

## 2014-11-30 NOTE — Progress Notes (Signed)
[place] for heparin >> eliquis Indication: pulmonary embolus, LLE DVT  Allergies  Allergen Reactions  . Sulfa Antibiotics Hives    Patient Measurements: Height: 6\' 1"  (185.4 cm) Weight: (!) 309 lb 8 oz (140.388 kg) IBW/kg (Calculated) : 79.9 Heparin Dosing Weight: 112 kg  Vital Signs: Temp: 98.5 F (36.9 C) (11/15 0353) Temp Source: Oral (11/15 0353) BP: 121/58 mmHg (11/15 0353) Pulse Rate: 83 (11/15 0353)  Labs:  Recent Labs  11/28/14 1859 11/29/14 0339 11/29/14 0932 11/30/14 0257  HGB 15.7 15.4  --  14.8  HCT 44.9 45.0  --  43.3  PLT 169 154  --  180  HEPARINUNFRC  --  0.40 0.60 0.71*  CREATININE 0.78 0.86  --   --   TROPONINI <0.03  --   --   --     Estimated Creatinine Clearance: 127.8 mL/min (by C-G formula based on Cr of 0.86).  Assessment: 64 YO M who presented with right sides chest pain and cough along with hemoptysis. Found to have PE (note he reported left sided calf pain as well).  Duplex done 11/14 shows + LLE DVT also.  Started on IV heparin 11/13 now to transition to Eliquis for VTE treatment.  CBC stable, no bleeding noted.  Goal of Therapy:  Heparin level 0.3-0.7 units/ml Monitor platelets by anticoagulation protocol: Yes   Plan:  Discontinue heparin infusion and associated labs. Eliquis 10mg  BID x 7 days, followed by 5mg  BID thereafter Eliquis education materials provided  [employer], Pharm.D., BCPS Clinical Pharmacist Pager [phone] 11/30/2014 10:44 AM

## 2014-11-30 NOTE — Discharge Instructions (Signed)
Information on my medicine - ELIQUIS (apixaban)  This medication education was reviewed with me or my healthcare representative as part of my discharge preparation.  The pharmacist that spoke with me during my hospital stay was: [employer], Pharm.D.  Why was Eliquis prescribed for you? Eliquis was prescribed to treat blood clots that may have been found in the veins of your legs (deep vein thrombosis) or in your lungs (pulmonary embolism) and to reduce the risk of them occurring again.  What do You need to know about Eliquis ? The starting dose is 10 mg (two 5 mg tablets) taken TWICE daily for the FIRST SEVEN (7) DAYS, then on 12/07/14  the dose is reduced to ONE 5 mg tablet taken TWICE daily.  Eliquis may be taken with or without food.   Try to take the dose about the same time in the morning and in the evening. If you have difficulty swallowing the tablet whole please discuss with your pharmacist how to take the medication safely.  Take Eliquis exactly as prescribed and DO NOT stop taking Eliquis without talking to the doctor who prescribed the medication.  Stopping may increase your risk of developing a new blood clot.  Refill your prescription before you run out.  After discharge, you should have regular check-up appointments with your healthcare provider that is prescribing your Eliquis.    What do you do if you miss a dose? If a dose of ELIQUIS is not taken at the scheduled time, take it as soon as possible on the same day and twice-daily administration should be resumed. The dose should not be doubled to make up for a missed dose.  Important Safety Information A possible side effect of Eliquis is bleeding. You should call your healthcare provider right away if you experience any of the following: ? Bleeding from an injury or your nose that does not stop. ? Unusual colored urine (red or dark brown) or unusual colored stools (red or black). ? Unusual bruising for unknown  reasons. ? A serious fall or if you hit your head (even if there is no bleeding).  Some medicines may interact with Eliquis and might increase your risk of bleeding or clotting while on Eliquis. To help avoid this, consult your healthcare provider or pharmacist prior to using any new prescription or non-prescription medications, including herbals, vitamins, non-steroidal anti-inflammatory drugs (NSAIDs) and supplements.  This website has more information on Eliquis (apixaban): http://www.eliquis.com/eliquis/home

## 2014-11-30 NOTE — Progress Notes (Signed)
Pt/family given discharge instructions, medication lists, follow up appointments, and when to call the doctor.  Pt/family verbalizes understanding. Keneth Borg McClintock, RN   

## 2014-11-30 NOTE — Progress Notes (Signed)
Utilization review completed.  

## 2014-12-01 LAB — PROTHROMBIN GENE MUTATION

## 2014-12-01 LAB — PROTEIN C, TOTAL: PROTEIN C, TOTAL: 80 % (ref 60–150)

## 2014-12-01 LAB — BETA-2-GLYCOPROTEIN I ABS, IGG/M/A: Beta-2-Glycoprotein I IgM: <9 GPI IgM units (ref 0–32)

## 2014-12-02 LAB — LUPUS ANTICOAGULANT PANEL
DRVVT: 47.2 s — ABNORMAL HIGH (ref 0.0–44.0)
PTT Lupus Anticoagulant: 37.3 s (ref 0.0–40.6)

## 2014-12-02 LAB — FACTOR 5 LEIDEN

## 2014-12-02 LAB — PROTEIN S, TOTAL: PROTEIN S AG TOTAL: 134 % (ref 60–150)

## 2014-12-02 LAB — CARDIOLIPIN ANTIBODIES, IGG, IGM, IGA
Anticardiolipin IgA: <9 APL U/mL (ref 0–11)
Anticardiolipin IgG: <9 GPL U/mL (ref 0–14)

## 2014-12-02 LAB — PROTEIN S ACTIVITY: PROTEIN S ACTIVITY: 106 % (ref 63–140)

## 2014-12-02 LAB — PROTEIN C ACTIVITY: PROTEIN C ACTIVITY: 98 % (ref 73–180)

## 2014-12-02 LAB — DRVVT MIX: dRVVT Mix: 40.2 s (ref 0.0–44.0)

## 2014-12-04 LAB — HEMOGLOBIN A1C
Hgb A1c MFr Bld: 5.9 % — ABNORMAL HIGH (ref 4.8–5.6)
MEAN PLASMA GLUCOSE: 123 mg/dL

## 2014-12-08 DIAGNOSIS — Z1389 Encounter for screening for other disorder: Secondary | ICD-10-CM | POA: Diagnosis not present

## 2014-12-08 DIAGNOSIS — Z6841 Body Mass Index (BMI) 40.0 and over, adult: Secondary | ICD-10-CM | POA: Diagnosis not present

## 2014-12-08 DIAGNOSIS — Z79899 Other long term (current) drug therapy: Secondary | ICD-10-CM | POA: Diagnosis not present

## 2014-12-08 DIAGNOSIS — R7303 Prediabetes: Secondary | ICD-10-CM | POA: Diagnosis not present

## 2014-12-08 DIAGNOSIS — I517 Cardiomegaly: Secondary | ICD-10-CM | POA: Diagnosis not present

## 2014-12-08 DIAGNOSIS — R042 Hemoptysis: Secondary | ICD-10-CM | POA: Diagnosis not present

## 2014-12-08 DIAGNOSIS — I2699 Other pulmonary embolism without acute cor pulmonale: Secondary | ICD-10-CM | POA: Diagnosis not present

## 2014-12-08 DIAGNOSIS — J9811 Atelectasis: Secondary | ICD-10-CM | POA: Diagnosis not present

## 2014-12-08 DIAGNOSIS — I82409 Acute embolism and thrombosis of unspecified deep veins of unspecified lower extremity: Secondary | ICD-10-CM | POA: Diagnosis not present

## 2015-03-15 DIAGNOSIS — E7800 Pure hypercholesterolemia, unspecified: Secondary | ICD-10-CM | POA: Diagnosis not present

## 2015-03-15 DIAGNOSIS — Z125 Encounter for screening for malignant neoplasm of prostate: Secondary | ICD-10-CM | POA: Diagnosis not present

## 2015-03-15 DIAGNOSIS — R7303 Prediabetes: Secondary | ICD-10-CM | POA: Diagnosis not present

## 2015-03-15 DIAGNOSIS — Z79899 Other long term (current) drug therapy: Secondary | ICD-10-CM | POA: Diagnosis not present

## 2015-03-15 DIAGNOSIS — I2699 Other pulmonary embolism without acute cor pulmonale: Secondary | ICD-10-CM | POA: Diagnosis not present

## 2015-03-15 DIAGNOSIS — Z6841 Body Mass Index (BMI) 40.0 and over, adult: Secondary | ICD-10-CM | POA: Diagnosis not present

## 2015-03-22 DIAGNOSIS — H35722 Serous detachment of retinal pigment epithelium, left eye: Secondary | ICD-10-CM | POA: Diagnosis not present

## 2015-03-22 DIAGNOSIS — H353111 Nonexudative age-related macular degeneration, right eye, early dry stage: Secondary | ICD-10-CM | POA: Diagnosis not present

## 2015-03-22 DIAGNOSIS — H35373 Puckering of macula, bilateral: Secondary | ICD-10-CM | POA: Diagnosis not present

## 2015-03-22 DIAGNOSIS — H35342 Macular cyst, hole, or pseudohole, left eye: Secondary | ICD-10-CM | POA: Diagnosis not present

## 2015-03-22 DIAGNOSIS — H11021 Central pterygium of right eye: Secondary | ICD-10-CM | POA: Diagnosis not present

## 2015-03-22 DIAGNOSIS — H52223 Regular astigmatism, bilateral: Secondary | ICD-10-CM | POA: Diagnosis not present

## 2015-03-22 DIAGNOSIS — H43313 Vitreous membranes and strands, bilateral: Secondary | ICD-10-CM | POA: Diagnosis not present

## 2015-03-22 DIAGNOSIS — H43813 Vitreous degeneration, bilateral: Secondary | ICD-10-CM | POA: Diagnosis not present

## 2015-03-22 DIAGNOSIS — H25813 Combined forms of age-related cataract, bilateral: Secondary | ICD-10-CM | POA: Diagnosis not present

## 2015-03-22 DIAGNOSIS — H40013 Open angle with borderline findings, low risk, bilateral: Secondary | ICD-10-CM | POA: Diagnosis not present

## 2015-03-22 DIAGNOSIS — H47233 Glaucomatous optic atrophy, bilateral: Secondary | ICD-10-CM | POA: Diagnosis not present

## 2015-03-22 DIAGNOSIS — H43393 Other vitreous opacities, bilateral: Secondary | ICD-10-CM | POA: Diagnosis not present

## 2015-04-05 DIAGNOSIS — H40013 Open angle with borderline findings, low risk, bilateral: Secondary | ICD-10-CM | POA: Diagnosis not present

## 2015-04-05 DIAGNOSIS — H47233 Glaucomatous optic atrophy, bilateral: Secondary | ICD-10-CM | POA: Diagnosis not present

## 2015-07-06 DIAGNOSIS — H35342 Macular cyst, hole, or pseudohole, left eye: Secondary | ICD-10-CM | POA: Diagnosis not present

## 2015-07-06 DIAGNOSIS — H47233 Glaucomatous optic atrophy, bilateral: Secondary | ICD-10-CM | POA: Diagnosis not present

## 2015-07-06 DIAGNOSIS — H40013 Open angle with borderline findings, low risk, bilateral: Secondary | ICD-10-CM | POA: Diagnosis not present

## 2015-07-06 DIAGNOSIS — H35372 Puckering of macula, left eye: Secondary | ICD-10-CM | POA: Diagnosis not present

## 2015-07-06 DIAGNOSIS — H35722 Serous detachment of retinal pigment epithelium, left eye: Secondary | ICD-10-CM | POA: Diagnosis not present

## 2015-10-28 DIAGNOSIS — I82402 Acute embolism and thrombosis of unspecified deep veins of left lower extremity: Secondary | ICD-10-CM | POA: Diagnosis not present

## 2015-10-28 DIAGNOSIS — R7303 Prediabetes: Secondary | ICD-10-CM | POA: Diagnosis not present

## 2015-10-28 DIAGNOSIS — I2699 Other pulmonary embolism without acute cor pulmonale: Secondary | ICD-10-CM | POA: Diagnosis not present

## 2015-10-28 DIAGNOSIS — E7800 Pure hypercholesterolemia, unspecified: Secondary | ICD-10-CM | POA: Diagnosis not present

## 2015-12-23 DIAGNOSIS — H35722 Serous detachment of retinal pigment epithelium, left eye: Secondary | ICD-10-CM | POA: Diagnosis not present

## 2015-12-23 DIAGNOSIS — H35352 Cystoid macular degeneration, left eye: Secondary | ICD-10-CM | POA: Diagnosis not present

## 2015-12-23 DIAGNOSIS — H35342 Macular cyst, hole, or pseudohole, left eye: Secondary | ICD-10-CM | POA: Diagnosis not present

## 2015-12-30 ENCOUNTER — Encounter: Payer: Self-pay | Admitting: *Deleted

## 2016-01-05 DIAGNOSIS — E7800 Pure hypercholesterolemia, unspecified: Secondary | ICD-10-CM | POA: Diagnosis not present

## 2016-01-05 DIAGNOSIS — R7303 Prediabetes: Secondary | ICD-10-CM | POA: Diagnosis not present

## 2016-01-05 DIAGNOSIS — Z125 Encounter for screening for malignant neoplasm of prostate: Secondary | ICD-10-CM | POA: Diagnosis not present

## 2016-01-05 DIAGNOSIS — I82402 Acute embolism and thrombosis of unspecified deep veins of left lower extremity: Secondary | ICD-10-CM | POA: Diagnosis not present

## 2016-01-05 DIAGNOSIS — I2699 Other pulmonary embolism without acute cor pulmonale: Secondary | ICD-10-CM | POA: Diagnosis not present

## 2016-01-05 DIAGNOSIS — L309 Dermatitis, unspecified: Secondary | ICD-10-CM | POA: Diagnosis not present

## 2016-01-05 DIAGNOSIS — Z1159 Encounter for screening for other viral diseases: Secondary | ICD-10-CM | POA: Diagnosis not present

## 2016-01-05 DIAGNOSIS — Z1389 Encounter for screening for other disorder: Secondary | ICD-10-CM | POA: Diagnosis not present

## 2016-01-05 DIAGNOSIS — Z0000 Encounter for general adult medical examination without abnormal findings: Secondary | ICD-10-CM | POA: Diagnosis not present

## 2016-01-31 DIAGNOSIS — H33102 Unspecified retinoschisis, left eye: Secondary | ICD-10-CM | POA: Diagnosis not present

## 2016-01-31 DIAGNOSIS — H353221 Exudative age-related macular degeneration, left eye, with active choroidal neovascularization: Secondary | ICD-10-CM | POA: Diagnosis not present

## 2016-02-14 ENCOUNTER — Encounter: Payer: Self-pay | Admitting: Internal Medicine

## 2016-02-16 DIAGNOSIS — H353221 Exudative age-related macular degeneration, left eye, with active choroidal neovascularization: Secondary | ICD-10-CM | POA: Diagnosis not present

## 2016-02-24 DIAGNOSIS — Z23 Encounter for immunization: Secondary | ICD-10-CM | POA: Diagnosis not present

## 2016-02-24 DIAGNOSIS — B999 Unspecified infectious disease: Secondary | ICD-10-CM | POA: Diagnosis not present

## 2016-03-22 DIAGNOSIS — L03116 Cellulitis of left lower limb: Secondary | ICD-10-CM | POA: Diagnosis not present

## 2016-04-06 DIAGNOSIS — H353221 Exudative age-related macular degeneration, left eye, with active choroidal neovascularization: Secondary | ICD-10-CM | POA: Diagnosis not present

## 2016-04-19 DIAGNOSIS — I8310 Varicose veins of unspecified lower extremity with inflammation: Secondary | ICD-10-CM | POA: Diagnosis not present

## 2016-04-19 DIAGNOSIS — B958 Unspecified staphylococcus as the cause of diseases classified elsewhere: Secondary | ICD-10-CM | POA: Diagnosis not present

## 2016-05-17 DIAGNOSIS — H353221 Exudative age-related macular degeneration, left eye, with active choroidal neovascularization: Secondary | ICD-10-CM | POA: Diagnosis not present

## 2016-05-23 DIAGNOSIS — M9905 Segmental and somatic dysfunction of pelvic region: Secondary | ICD-10-CM | POA: Diagnosis not present

## 2016-05-23 DIAGNOSIS — M5388 Other specified dorsopathies, sacral and sacrococcygeal region: Secondary | ICD-10-CM | POA: Diagnosis not present

## 2016-05-23 DIAGNOSIS — M9903 Segmental and somatic dysfunction of lumbar region: Secondary | ICD-10-CM | POA: Diagnosis not present

## 2016-05-23 DIAGNOSIS — M4126 Other idiopathic scoliosis, lumbar region: Secondary | ICD-10-CM | POA: Diagnosis not present

## 2016-05-23 DIAGNOSIS — M4307 Spondylolysis, lumbosacral region: Secondary | ICD-10-CM | POA: Diagnosis not present

## 2016-05-23 DIAGNOSIS — M9904 Segmental and somatic dysfunction of sacral region: Secondary | ICD-10-CM | POA: Diagnosis not present

## 2016-05-24 DIAGNOSIS — M5186 Other intervertebral disc disorders, lumbar region: Secondary | ICD-10-CM | POA: Diagnosis not present

## 2016-05-24 DIAGNOSIS — M545 Low back pain: Secondary | ICD-10-CM | POA: Diagnosis not present

## 2016-05-24 DIAGNOSIS — M438X6 Other specified deforming dorsopathies, lumbar region: Secondary | ICD-10-CM | POA: Diagnosis not present

## 2016-05-25 DIAGNOSIS — M9903 Segmental and somatic dysfunction of lumbar region: Secondary | ICD-10-CM | POA: Diagnosis not present

## 2016-05-25 DIAGNOSIS — M5388 Other specified dorsopathies, sacral and sacrococcygeal region: Secondary | ICD-10-CM | POA: Diagnosis not present

## 2016-05-25 DIAGNOSIS — M4307 Spondylolysis, lumbosacral region: Secondary | ICD-10-CM | POA: Diagnosis not present

## 2016-05-25 DIAGNOSIS — M4126 Other idiopathic scoliosis, lumbar region: Secondary | ICD-10-CM | POA: Diagnosis not present

## 2016-05-25 DIAGNOSIS — M9904 Segmental and somatic dysfunction of sacral region: Secondary | ICD-10-CM | POA: Diagnosis not present

## 2016-05-25 DIAGNOSIS — M9905 Segmental and somatic dysfunction of pelvic region: Secondary | ICD-10-CM | POA: Diagnosis not present

## 2016-05-29 DIAGNOSIS — M9905 Segmental and somatic dysfunction of pelvic region: Secondary | ICD-10-CM | POA: Diagnosis not present

## 2016-05-29 DIAGNOSIS — M9904 Segmental and somatic dysfunction of sacral region: Secondary | ICD-10-CM | POA: Diagnosis not present

## 2016-05-29 DIAGNOSIS — M5388 Other specified dorsopathies, sacral and sacrococcygeal region: Secondary | ICD-10-CM | POA: Diagnosis not present

## 2016-05-29 DIAGNOSIS — M4126 Other idiopathic scoliosis, lumbar region: Secondary | ICD-10-CM | POA: Diagnosis not present

## 2016-05-29 DIAGNOSIS — M4307 Spondylolysis, lumbosacral region: Secondary | ICD-10-CM | POA: Diagnosis not present

## 2016-05-29 DIAGNOSIS — M9903 Segmental and somatic dysfunction of lumbar region: Secondary | ICD-10-CM | POA: Diagnosis not present

## 2016-05-31 DIAGNOSIS — M5388 Other specified dorsopathies, sacral and sacrococcygeal region: Secondary | ICD-10-CM | POA: Diagnosis not present

## 2016-05-31 DIAGNOSIS — M4126 Other idiopathic scoliosis, lumbar region: Secondary | ICD-10-CM | POA: Diagnosis not present

## 2016-05-31 DIAGNOSIS — M4307 Spondylolysis, lumbosacral region: Secondary | ICD-10-CM | POA: Diagnosis not present

## 2016-05-31 DIAGNOSIS — M9904 Segmental and somatic dysfunction of sacral region: Secondary | ICD-10-CM | POA: Diagnosis not present

## 2016-05-31 DIAGNOSIS — M9905 Segmental and somatic dysfunction of pelvic region: Secondary | ICD-10-CM | POA: Diagnosis not present

## 2016-05-31 DIAGNOSIS — M9903 Segmental and somatic dysfunction of lumbar region: Secondary | ICD-10-CM | POA: Diagnosis not present

## 2016-06-04 DIAGNOSIS — M9905 Segmental and somatic dysfunction of pelvic region: Secondary | ICD-10-CM | POA: Diagnosis not present

## 2016-06-04 DIAGNOSIS — M4126 Other idiopathic scoliosis, lumbar region: Secondary | ICD-10-CM | POA: Diagnosis not present

## 2016-06-04 DIAGNOSIS — M9903 Segmental and somatic dysfunction of lumbar region: Secondary | ICD-10-CM | POA: Diagnosis not present

## 2016-06-04 DIAGNOSIS — M4307 Spondylolysis, lumbosacral region: Secondary | ICD-10-CM | POA: Diagnosis not present

## 2016-06-04 DIAGNOSIS — M5388 Other specified dorsopathies, sacral and sacrococcygeal region: Secondary | ICD-10-CM | POA: Diagnosis not present

## 2016-06-04 DIAGNOSIS — M9904 Segmental and somatic dysfunction of sacral region: Secondary | ICD-10-CM | POA: Diagnosis not present

## 2016-06-06 DIAGNOSIS — M4307 Spondylolysis, lumbosacral region: Secondary | ICD-10-CM | POA: Diagnosis not present

## 2016-06-06 DIAGNOSIS — M5388 Other specified dorsopathies, sacral and sacrococcygeal region: Secondary | ICD-10-CM | POA: Diagnosis not present

## 2016-06-06 DIAGNOSIS — M9903 Segmental and somatic dysfunction of lumbar region: Secondary | ICD-10-CM | POA: Diagnosis not present

## 2016-06-06 DIAGNOSIS — M9905 Segmental and somatic dysfunction of pelvic region: Secondary | ICD-10-CM | POA: Diagnosis not present

## 2016-06-06 DIAGNOSIS — M9904 Segmental and somatic dysfunction of sacral region: Secondary | ICD-10-CM | POA: Diagnosis not present

## 2016-06-06 DIAGNOSIS — M4126 Other idiopathic scoliosis, lumbar region: Secondary | ICD-10-CM | POA: Diagnosis not present

## 2016-06-09 DIAGNOSIS — M4126 Other idiopathic scoliosis, lumbar region: Secondary | ICD-10-CM | POA: Diagnosis not present

## 2016-06-09 DIAGNOSIS — M9903 Segmental and somatic dysfunction of lumbar region: Secondary | ICD-10-CM | POA: Diagnosis not present

## 2016-06-09 DIAGNOSIS — M4307 Spondylolysis, lumbosacral region: Secondary | ICD-10-CM | POA: Diagnosis not present

## 2016-06-09 DIAGNOSIS — M9904 Segmental and somatic dysfunction of sacral region: Secondary | ICD-10-CM | POA: Diagnosis not present

## 2016-06-09 DIAGNOSIS — M9905 Segmental and somatic dysfunction of pelvic region: Secondary | ICD-10-CM | POA: Diagnosis not present

## 2016-06-09 DIAGNOSIS — M5388 Other specified dorsopathies, sacral and sacrococcygeal region: Secondary | ICD-10-CM | POA: Diagnosis not present

## 2016-06-12 DIAGNOSIS — M5388 Other specified dorsopathies, sacral and sacrococcygeal region: Secondary | ICD-10-CM | POA: Diagnosis not present

## 2016-06-12 DIAGNOSIS — M9903 Segmental and somatic dysfunction of lumbar region: Secondary | ICD-10-CM | POA: Diagnosis not present

## 2016-06-12 DIAGNOSIS — M9905 Segmental and somatic dysfunction of pelvic region: Secondary | ICD-10-CM | POA: Diagnosis not present

## 2016-06-12 DIAGNOSIS — M4307 Spondylolysis, lumbosacral region: Secondary | ICD-10-CM | POA: Diagnosis not present

## 2016-06-12 DIAGNOSIS — M9904 Segmental and somatic dysfunction of sacral region: Secondary | ICD-10-CM | POA: Diagnosis not present

## 2016-06-12 DIAGNOSIS — M4126 Other idiopathic scoliosis, lumbar region: Secondary | ICD-10-CM | POA: Diagnosis not present

## 2016-06-14 DIAGNOSIS — M4126 Other idiopathic scoliosis, lumbar region: Secondary | ICD-10-CM | POA: Diagnosis not present

## 2016-06-14 DIAGNOSIS — M9905 Segmental and somatic dysfunction of pelvic region: Secondary | ICD-10-CM | POA: Diagnosis not present

## 2016-06-14 DIAGNOSIS — M9903 Segmental and somatic dysfunction of lumbar region: Secondary | ICD-10-CM | POA: Diagnosis not present

## 2016-06-14 DIAGNOSIS — M4307 Spondylolysis, lumbosacral region: Secondary | ICD-10-CM | POA: Diagnosis not present

## 2016-06-14 DIAGNOSIS — M9904 Segmental and somatic dysfunction of sacral region: Secondary | ICD-10-CM | POA: Diagnosis not present

## 2016-06-14 DIAGNOSIS — M5388 Other specified dorsopathies, sacral and sacrococcygeal region: Secondary | ICD-10-CM | POA: Diagnosis not present

## 2016-06-18 DIAGNOSIS — L97521 Non-pressure chronic ulcer of other part of left foot limited to breakdown of skin: Secondary | ICD-10-CM | POA: Diagnosis not present

## 2016-06-18 DIAGNOSIS — I8310 Varicose veins of unspecified lower extremity with inflammation: Secondary | ICD-10-CM | POA: Diagnosis not present

## 2016-06-19 DIAGNOSIS — M5388 Other specified dorsopathies, sacral and sacrococcygeal region: Secondary | ICD-10-CM | POA: Diagnosis not present

## 2016-06-19 DIAGNOSIS — M4126 Other idiopathic scoliosis, lumbar region: Secondary | ICD-10-CM | POA: Diagnosis not present

## 2016-06-19 DIAGNOSIS — M4307 Spondylolysis, lumbosacral region: Secondary | ICD-10-CM | POA: Diagnosis not present

## 2016-06-19 DIAGNOSIS — M9903 Segmental and somatic dysfunction of lumbar region: Secondary | ICD-10-CM | POA: Diagnosis not present

## 2016-06-19 DIAGNOSIS — M9904 Segmental and somatic dysfunction of sacral region: Secondary | ICD-10-CM | POA: Diagnosis not present

## 2016-06-19 DIAGNOSIS — M9905 Segmental and somatic dysfunction of pelvic region: Secondary | ICD-10-CM | POA: Diagnosis not present

## 2016-06-25 DIAGNOSIS — M9905 Segmental and somatic dysfunction of pelvic region: Secondary | ICD-10-CM | POA: Diagnosis not present

## 2016-06-25 DIAGNOSIS — M5388 Other specified dorsopathies, sacral and sacrococcygeal region: Secondary | ICD-10-CM | POA: Diagnosis not present

## 2016-06-25 DIAGNOSIS — M9903 Segmental and somatic dysfunction of lumbar region: Secondary | ICD-10-CM | POA: Diagnosis not present

## 2016-06-25 DIAGNOSIS — M4126 Other idiopathic scoliosis, lumbar region: Secondary | ICD-10-CM | POA: Diagnosis not present

## 2016-06-25 DIAGNOSIS — M4307 Spondylolysis, lumbosacral region: Secondary | ICD-10-CM | POA: Diagnosis not present

## 2016-06-25 DIAGNOSIS — M9904 Segmental and somatic dysfunction of sacral region: Secondary | ICD-10-CM | POA: Diagnosis not present

## 2016-06-26 DIAGNOSIS — R030 Elevated blood-pressure reading, without diagnosis of hypertension: Secondary | ICD-10-CM | POA: Diagnosis not present

## 2016-06-26 DIAGNOSIS — I2699 Other pulmonary embolism without acute cor pulmonale: Secondary | ICD-10-CM | POA: Diagnosis not present

## 2016-06-26 DIAGNOSIS — R7309 Other abnormal glucose: Secondary | ICD-10-CM | POA: Diagnosis not present

## 2016-06-26 DIAGNOSIS — E7800 Pure hypercholesterolemia, unspecified: Secondary | ICD-10-CM | POA: Diagnosis not present

## 2016-06-26 DIAGNOSIS — I82402 Acute embolism and thrombosis of unspecified deep veins of left lower extremity: Secondary | ICD-10-CM | POA: Diagnosis not present

## 2016-06-28 DIAGNOSIS — H353221 Exudative age-related macular degeneration, left eye, with active choroidal neovascularization: Secondary | ICD-10-CM | POA: Diagnosis not present

## 2016-07-04 DIAGNOSIS — M5388 Other specified dorsopathies, sacral and sacrococcygeal region: Secondary | ICD-10-CM | POA: Diagnosis not present

## 2016-07-04 DIAGNOSIS — M9905 Segmental and somatic dysfunction of pelvic region: Secondary | ICD-10-CM | POA: Diagnosis not present

## 2016-07-04 DIAGNOSIS — M9904 Segmental and somatic dysfunction of sacral region: Secondary | ICD-10-CM | POA: Diagnosis not present

## 2016-07-04 DIAGNOSIS — M9903 Segmental and somatic dysfunction of lumbar region: Secondary | ICD-10-CM | POA: Diagnosis not present

## 2016-07-04 DIAGNOSIS — M4306 Spondylolysis, lumbar region: Secondary | ICD-10-CM | POA: Diagnosis not present

## 2016-07-04 DIAGNOSIS — M47817 Spondylosis without myelopathy or radiculopathy, lumbosacral region: Secondary | ICD-10-CM | POA: Diagnosis not present

## 2016-08-06 DIAGNOSIS — M5388 Other specified dorsopathies, sacral and sacrococcygeal region: Secondary | ICD-10-CM | POA: Diagnosis not present

## 2016-08-06 DIAGNOSIS — M9904 Segmental and somatic dysfunction of sacral region: Secondary | ICD-10-CM | POA: Diagnosis not present

## 2016-08-06 DIAGNOSIS — M4307 Spondylolysis, lumbosacral region: Secondary | ICD-10-CM | POA: Diagnosis not present

## 2016-08-06 DIAGNOSIS — M9905 Segmental and somatic dysfunction of pelvic region: Secondary | ICD-10-CM | POA: Diagnosis not present

## 2016-08-06 DIAGNOSIS — M9903 Segmental and somatic dysfunction of lumbar region: Secondary | ICD-10-CM | POA: Diagnosis not present

## 2016-08-06 DIAGNOSIS — M47817 Spondylosis without myelopathy or radiculopathy, lumbosacral region: Secondary | ICD-10-CM | POA: Diagnosis not present

## 2016-08-09 DIAGNOSIS — H353221 Exudative age-related macular degeneration, left eye, with active choroidal neovascularization: Secondary | ICD-10-CM | POA: Diagnosis not present

## 2016-08-24 DIAGNOSIS — I82402 Acute embolism and thrombosis of unspecified deep veins of left lower extremity: Secondary | ICD-10-CM | POA: Diagnosis not present

## 2016-08-24 DIAGNOSIS — R030 Elevated blood-pressure reading, without diagnosis of hypertension: Secondary | ICD-10-CM | POA: Diagnosis not present

## 2016-08-27 DIAGNOSIS — M4307 Spondylolysis, lumbosacral region: Secondary | ICD-10-CM | POA: Diagnosis not present

## 2016-08-27 DIAGNOSIS — M9903 Segmental and somatic dysfunction of lumbar region: Secondary | ICD-10-CM | POA: Diagnosis not present

## 2016-08-27 DIAGNOSIS — M9904 Segmental and somatic dysfunction of sacral region: Secondary | ICD-10-CM | POA: Diagnosis not present

## 2016-08-27 DIAGNOSIS — M47817 Spondylosis without myelopathy or radiculopathy, lumbosacral region: Secondary | ICD-10-CM | POA: Diagnosis not present

## 2016-08-27 DIAGNOSIS — M9905 Segmental and somatic dysfunction of pelvic region: Secondary | ICD-10-CM | POA: Diagnosis not present

## 2016-08-27 DIAGNOSIS — M5388 Other specified dorsopathies, sacral and sacrococcygeal region: Secondary | ICD-10-CM | POA: Diagnosis not present

## 2016-09-19 DIAGNOSIS — M4725 Other spondylosis with radiculopathy, thoracolumbar region: Secondary | ICD-10-CM | POA: Diagnosis not present

## 2016-09-19 DIAGNOSIS — M9902 Segmental and somatic dysfunction of thoracic region: Secondary | ICD-10-CM | POA: Diagnosis not present

## 2016-09-19 DIAGNOSIS — M9904 Segmental and somatic dysfunction of sacral region: Secondary | ICD-10-CM | POA: Diagnosis not present

## 2016-09-19 DIAGNOSIS — M4307 Spondylolysis, lumbosacral region: Secondary | ICD-10-CM | POA: Diagnosis not present

## 2016-09-19 DIAGNOSIS — M4726 Other spondylosis with radiculopathy, lumbar region: Secondary | ICD-10-CM | POA: Diagnosis not present

## 2016-09-19 DIAGNOSIS — M9905 Segmental and somatic dysfunction of pelvic region: Secondary | ICD-10-CM | POA: Diagnosis not present

## 2016-09-20 DIAGNOSIS — H353221 Exudative age-related macular degeneration, left eye, with active choroidal neovascularization: Secondary | ICD-10-CM | POA: Diagnosis not present

## 2016-10-09 DIAGNOSIS — M9905 Segmental and somatic dysfunction of pelvic region: Secondary | ICD-10-CM | POA: Diagnosis not present

## 2016-10-09 DIAGNOSIS — M4307 Spondylolysis, lumbosacral region: Secondary | ICD-10-CM | POA: Diagnosis not present

## 2016-10-09 DIAGNOSIS — M4726 Other spondylosis with radiculopathy, lumbar region: Secondary | ICD-10-CM | POA: Diagnosis not present

## 2016-10-09 DIAGNOSIS — M9904 Segmental and somatic dysfunction of sacral region: Secondary | ICD-10-CM | POA: Diagnosis not present

## 2016-10-09 DIAGNOSIS — M4725 Other spondylosis with radiculopathy, thoracolumbar region: Secondary | ICD-10-CM | POA: Diagnosis not present

## 2016-10-09 DIAGNOSIS — M9902 Segmental and somatic dysfunction of thoracic region: Secondary | ICD-10-CM | POA: Diagnosis not present

## 2016-10-25 DIAGNOSIS — H353221 Exudative age-related macular degeneration, left eye, with active choroidal neovascularization: Secondary | ICD-10-CM | POA: Diagnosis not present

## 2016-11-01 DIAGNOSIS — M9901 Segmental and somatic dysfunction of cervical region: Secondary | ICD-10-CM | POA: Diagnosis not present

## 2016-11-01 DIAGNOSIS — M4303 Spondylolysis, cervicothoracic region: Secondary | ICD-10-CM | POA: Diagnosis not present

## 2016-11-01 DIAGNOSIS — M4307 Spondylolysis, lumbosacral region: Secondary | ICD-10-CM | POA: Diagnosis not present

## 2016-11-01 DIAGNOSIS — M9903 Segmental and somatic dysfunction of lumbar region: Secondary | ICD-10-CM | POA: Diagnosis not present

## 2016-11-01 DIAGNOSIS — M5388 Other specified dorsopathies, sacral and sacrococcygeal region: Secondary | ICD-10-CM | POA: Diagnosis not present

## 2016-11-01 DIAGNOSIS — M9904 Segmental and somatic dysfunction of sacral region: Secondary | ICD-10-CM | POA: Diagnosis not present

## 2016-11-28 DIAGNOSIS — M4304 Spondylolysis, thoracic region: Secondary | ICD-10-CM | POA: Diagnosis not present

## 2016-11-28 DIAGNOSIS — M9901 Segmental and somatic dysfunction of cervical region: Secondary | ICD-10-CM | POA: Diagnosis not present

## 2016-11-28 DIAGNOSIS — M4306 Spondylolysis, lumbar region: Secondary | ICD-10-CM | POA: Diagnosis not present

## 2016-11-28 DIAGNOSIS — M9902 Segmental and somatic dysfunction of thoracic region: Secondary | ICD-10-CM | POA: Diagnosis not present

## 2016-11-28 DIAGNOSIS — M4303 Spondylolysis, cervicothoracic region: Secondary | ICD-10-CM | POA: Diagnosis not present

## 2016-11-28 DIAGNOSIS — M9903 Segmental and somatic dysfunction of lumbar region: Secondary | ICD-10-CM | POA: Diagnosis not present

## 2016-11-29 DIAGNOSIS — H353221 Exudative age-related macular degeneration, left eye, with active choroidal neovascularization: Secondary | ICD-10-CM | POA: Diagnosis not present

## 2016-12-05 DIAGNOSIS — H33102 Unspecified retinoschisis, left eye: Secondary | ICD-10-CM | POA: Diagnosis not present

## 2016-12-05 DIAGNOSIS — H353111 Nonexudative age-related macular degeneration, right eye, early dry stage: Secondary | ICD-10-CM | POA: Diagnosis not present

## 2016-12-05 DIAGNOSIS — H40003 Preglaucoma, unspecified, bilateral: Secondary | ICD-10-CM | POA: Diagnosis not present

## 2017-01-01 DIAGNOSIS — M9901 Segmental and somatic dysfunction of cervical region: Secondary | ICD-10-CM | POA: Diagnosis not present

## 2017-01-01 DIAGNOSIS — M9902 Segmental and somatic dysfunction of thoracic region: Secondary | ICD-10-CM | POA: Diagnosis not present

## 2017-01-01 DIAGNOSIS — M4306 Spondylolysis, lumbar region: Secondary | ICD-10-CM | POA: Diagnosis not present

## 2017-01-01 DIAGNOSIS — M9903 Segmental and somatic dysfunction of lumbar region: Secondary | ICD-10-CM | POA: Diagnosis not present

## 2017-01-01 DIAGNOSIS — M4303 Spondylolysis, cervicothoracic region: Secondary | ICD-10-CM | POA: Diagnosis not present

## 2017-01-01 DIAGNOSIS — M4304 Spondylolysis, thoracic region: Secondary | ICD-10-CM | POA: Diagnosis not present

## 2017-01-10 DIAGNOSIS — H353221 Exudative age-related macular degeneration, left eye, with active choroidal neovascularization: Secondary | ICD-10-CM | POA: Diagnosis not present

## 2017-01-22 DIAGNOSIS — Z1389 Encounter for screening for other disorder: Secondary | ICD-10-CM | POA: Diagnosis not present

## 2017-01-22 DIAGNOSIS — Z0000 Encounter for general adult medical examination without abnormal findings: Secondary | ICD-10-CM | POA: Diagnosis not present

## 2017-01-22 DIAGNOSIS — I82402 Acute embolism and thrombosis of unspecified deep veins of left lower extremity: Secondary | ICD-10-CM | POA: Diagnosis not present

## 2017-01-22 DIAGNOSIS — Z125 Encounter for screening for malignant neoplasm of prostate: Secondary | ICD-10-CM | POA: Diagnosis not present

## 2017-01-22 DIAGNOSIS — R7303 Prediabetes: Secondary | ICD-10-CM | POA: Diagnosis not present

## 2017-01-22 DIAGNOSIS — R252 Cramp and spasm: Secondary | ICD-10-CM | POA: Diagnosis not present

## 2017-01-22 DIAGNOSIS — R739 Hyperglycemia, unspecified: Secondary | ICD-10-CM | POA: Diagnosis not present

## 2017-01-22 DIAGNOSIS — E7800 Pure hypercholesterolemia, unspecified: Secondary | ICD-10-CM | POA: Diagnosis not present

## 2017-02-07 DIAGNOSIS — H353221 Exudative age-related macular degeneration, left eye, with active choroidal neovascularization: Secondary | ICD-10-CM | POA: Diagnosis not present

## 2017-02-13 DIAGNOSIS — M9901 Segmental and somatic dysfunction of cervical region: Secondary | ICD-10-CM | POA: Diagnosis not present

## 2017-02-13 DIAGNOSIS — M5388 Other specified dorsopathies, sacral and sacrococcygeal region: Secondary | ICD-10-CM | POA: Diagnosis not present

## 2017-02-13 DIAGNOSIS — M9903 Segmental and somatic dysfunction of lumbar region: Secondary | ICD-10-CM | POA: Diagnosis not present

## 2017-02-13 DIAGNOSIS — M9904 Segmental and somatic dysfunction of sacral region: Secondary | ICD-10-CM | POA: Diagnosis not present

## 2017-02-13 DIAGNOSIS — M4306 Spondylolysis, lumbar region: Secondary | ICD-10-CM | POA: Diagnosis not present

## 2017-02-13 DIAGNOSIS — M4302 Spondylolysis, cervical region: Secondary | ICD-10-CM | POA: Diagnosis not present

## 2017-03-21 DIAGNOSIS — H353221 Exudative age-related macular degeneration, left eye, with active choroidal neovascularization: Secondary | ICD-10-CM | POA: Diagnosis not present

## 2017-03-26 DIAGNOSIS — M9902 Segmental and somatic dysfunction of thoracic region: Secondary | ICD-10-CM | POA: Diagnosis not present

## 2017-03-26 DIAGNOSIS — M9903 Segmental and somatic dysfunction of lumbar region: Secondary | ICD-10-CM | POA: Diagnosis not present

## 2017-03-26 DIAGNOSIS — M47817 Spondylosis without myelopathy or radiculopathy, lumbosacral region: Secondary | ICD-10-CM | POA: Diagnosis not present

## 2017-03-26 DIAGNOSIS — M9901 Segmental and somatic dysfunction of cervical region: Secondary | ICD-10-CM | POA: Diagnosis not present

## 2017-03-26 DIAGNOSIS — M4305 Spondylolysis, thoracolumbar region: Secondary | ICD-10-CM | POA: Diagnosis not present

## 2017-03-26 DIAGNOSIS — M4302 Spondylolysis, cervical region: Secondary | ICD-10-CM | POA: Diagnosis not present

## 2017-04-08 DIAGNOSIS — M9903 Segmental and somatic dysfunction of lumbar region: Secondary | ICD-10-CM | POA: Diagnosis not present

## 2017-04-08 DIAGNOSIS — M4302 Spondylolysis, cervical region: Secondary | ICD-10-CM | POA: Diagnosis not present

## 2017-04-08 DIAGNOSIS — M47817 Spondylosis without myelopathy or radiculopathy, lumbosacral region: Secondary | ICD-10-CM | POA: Diagnosis not present

## 2017-04-08 DIAGNOSIS — M9902 Segmental and somatic dysfunction of thoracic region: Secondary | ICD-10-CM | POA: Diagnosis not present

## 2017-04-08 DIAGNOSIS — M9901 Segmental and somatic dysfunction of cervical region: Secondary | ICD-10-CM | POA: Diagnosis not present

## 2017-04-08 DIAGNOSIS — M4305 Spondylolysis, thoracolumbar region: Secondary | ICD-10-CM | POA: Diagnosis not present

## 2017-04-25 DIAGNOSIS — H353221 Exudative age-related macular degeneration, left eye, with active choroidal neovascularization: Secondary | ICD-10-CM | POA: Diagnosis not present

## 2017-05-02 DIAGNOSIS — M4304 Spondylolysis, thoracic region: Secondary | ICD-10-CM | POA: Diagnosis not present

## 2017-05-02 DIAGNOSIS — M9902 Segmental and somatic dysfunction of thoracic region: Secondary | ICD-10-CM | POA: Diagnosis not present

## 2017-05-02 DIAGNOSIS — M9901 Segmental and somatic dysfunction of cervical region: Secondary | ICD-10-CM | POA: Diagnosis not present

## 2017-05-02 DIAGNOSIS — M4302 Spondylolysis, cervical region: Secondary | ICD-10-CM | POA: Diagnosis not present

## 2017-05-02 DIAGNOSIS — M9903 Segmental and somatic dysfunction of lumbar region: Secondary | ICD-10-CM | POA: Diagnosis not present

## 2017-05-02 DIAGNOSIS — M4306 Spondylolysis, lumbar region: Secondary | ICD-10-CM | POA: Diagnosis not present

## 2017-05-09 DIAGNOSIS — M9903 Segmental and somatic dysfunction of lumbar region: Secondary | ICD-10-CM | POA: Diagnosis not present

## 2017-05-09 DIAGNOSIS — M9902 Segmental and somatic dysfunction of thoracic region: Secondary | ICD-10-CM | POA: Diagnosis not present

## 2017-05-09 DIAGNOSIS — M9901 Segmental and somatic dysfunction of cervical region: Secondary | ICD-10-CM | POA: Diagnosis not present

## 2017-05-09 DIAGNOSIS — M4306 Spondylolysis, lumbar region: Secondary | ICD-10-CM | POA: Diagnosis not present

## 2017-05-09 DIAGNOSIS — M4302 Spondylolysis, cervical region: Secondary | ICD-10-CM | POA: Diagnosis not present

## 2017-05-09 DIAGNOSIS — M4304 Spondylolysis, thoracic region: Secondary | ICD-10-CM | POA: Diagnosis not present

## 2017-05-29 DIAGNOSIS — H353221 Exudative age-related macular degeneration, left eye, with active choroidal neovascularization: Secondary | ICD-10-CM | POA: Diagnosis not present

## 2017-06-06 DIAGNOSIS — M9901 Segmental and somatic dysfunction of cervical region: Secondary | ICD-10-CM | POA: Diagnosis not present

## 2017-06-06 DIAGNOSIS — M4302 Spondylolysis, cervical region: Secondary | ICD-10-CM | POA: Diagnosis not present

## 2017-06-06 DIAGNOSIS — M4304 Spondylolysis, thoracic region: Secondary | ICD-10-CM | POA: Diagnosis not present

## 2017-06-06 DIAGNOSIS — M9903 Segmental and somatic dysfunction of lumbar region: Secondary | ICD-10-CM | POA: Diagnosis not present

## 2017-06-06 DIAGNOSIS — M9902 Segmental and somatic dysfunction of thoracic region: Secondary | ICD-10-CM | POA: Diagnosis not present

## 2017-06-06 DIAGNOSIS — M4306 Spondylolysis, lumbar region: Secondary | ICD-10-CM | POA: Diagnosis not present

## 2017-06-07 DIAGNOSIS — M47812 Spondylosis without myelopathy or radiculopathy, cervical region: Secondary | ICD-10-CM | POA: Diagnosis not present

## 2017-06-07 DIAGNOSIS — M542 Cervicalgia: Secondary | ICD-10-CM | POA: Diagnosis not present

## 2017-07-04 DIAGNOSIS — H353221 Exudative age-related macular degeneration, left eye, with active choroidal neovascularization: Secondary | ICD-10-CM | POA: Diagnosis not present

## 2017-07-10 DIAGNOSIS — M9903 Segmental and somatic dysfunction of lumbar region: Secondary | ICD-10-CM | POA: Diagnosis not present

## 2017-07-10 DIAGNOSIS — M9902 Segmental and somatic dysfunction of thoracic region: Secondary | ICD-10-CM | POA: Diagnosis not present

## 2017-07-10 DIAGNOSIS — M4306 Spondylolysis, lumbar region: Secondary | ICD-10-CM | POA: Diagnosis not present

## 2017-07-10 DIAGNOSIS — M4302 Spondylolysis, cervical region: Secondary | ICD-10-CM | POA: Diagnosis not present

## 2017-07-10 DIAGNOSIS — M9901 Segmental and somatic dysfunction of cervical region: Secondary | ICD-10-CM | POA: Diagnosis not present

## 2017-07-10 DIAGNOSIS — M4304 Spondylolysis, thoracic region: Secondary | ICD-10-CM | POA: Diagnosis not present

## 2017-08-07 DIAGNOSIS — M4304 Spondylolysis, thoracic region: Secondary | ICD-10-CM | POA: Diagnosis not present

## 2017-08-07 DIAGNOSIS — M9901 Segmental and somatic dysfunction of cervical region: Secondary | ICD-10-CM | POA: Diagnosis not present

## 2017-08-07 DIAGNOSIS — M9902 Segmental and somatic dysfunction of thoracic region: Secondary | ICD-10-CM | POA: Diagnosis not present

## 2017-08-07 DIAGNOSIS — M9903 Segmental and somatic dysfunction of lumbar region: Secondary | ICD-10-CM | POA: Diagnosis not present

## 2017-08-07 DIAGNOSIS — M4306 Spondylolysis, lumbar region: Secondary | ICD-10-CM | POA: Diagnosis not present

## 2017-08-07 DIAGNOSIS — M4302 Spondylolysis, cervical region: Secondary | ICD-10-CM | POA: Diagnosis not present

## 2017-08-15 DIAGNOSIS — H353221 Exudative age-related macular degeneration, left eye, with active choroidal neovascularization: Secondary | ICD-10-CM | POA: Diagnosis not present

## 2017-09-10 DIAGNOSIS — M9903 Segmental and somatic dysfunction of lumbar region: Secondary | ICD-10-CM | POA: Diagnosis not present

## 2017-09-10 DIAGNOSIS — M4303 Spondylolysis, cervicothoracic region: Secondary | ICD-10-CM | POA: Diagnosis not present

## 2017-09-10 DIAGNOSIS — M4302 Spondylolysis, cervical region: Secondary | ICD-10-CM | POA: Diagnosis not present

## 2017-09-10 DIAGNOSIS — M4315 Spondylolisthesis, thoracolumbar region: Secondary | ICD-10-CM | POA: Diagnosis not present

## 2017-09-10 DIAGNOSIS — M9902 Segmental and somatic dysfunction of thoracic region: Secondary | ICD-10-CM | POA: Diagnosis not present

## 2017-09-10 DIAGNOSIS — M9901 Segmental and somatic dysfunction of cervical region: Secondary | ICD-10-CM | POA: Diagnosis not present

## 2017-09-19 DIAGNOSIS — H353221 Exudative age-related macular degeneration, left eye, with active choroidal neovascularization: Secondary | ICD-10-CM | POA: Diagnosis not present

## 2017-09-23 IMAGING — CR DG CHEST 2V
2 series · 2 of 2 positions shown · non-contrast
Comparison: None.

CLINICAL DATA: Acute onset of severe shortness of breath and
right-sided chest pain. Hemoptysis and wheezing. Initial encounter.

EXAM:
CHEST  2 VIEW

[chest pa]
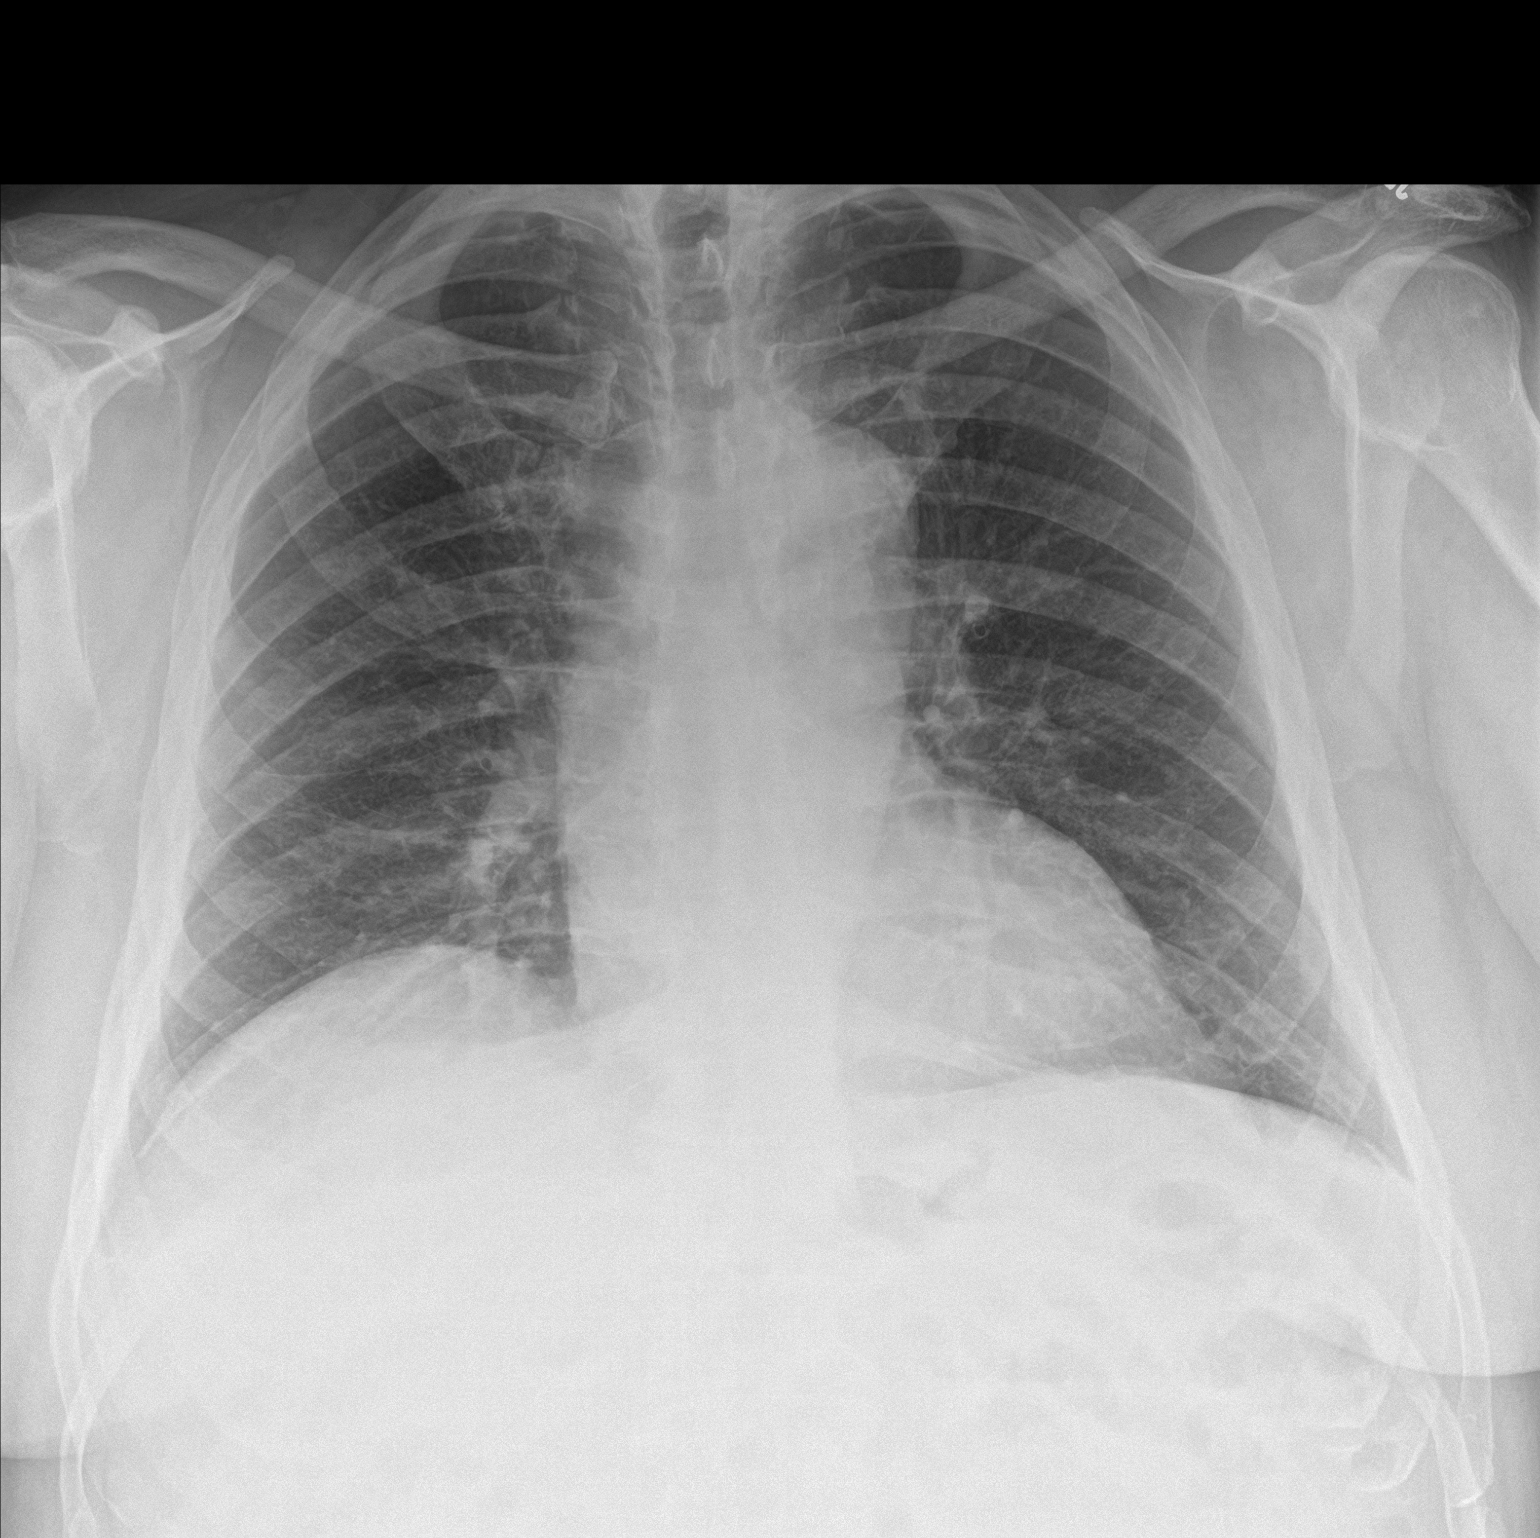

[chest lat]
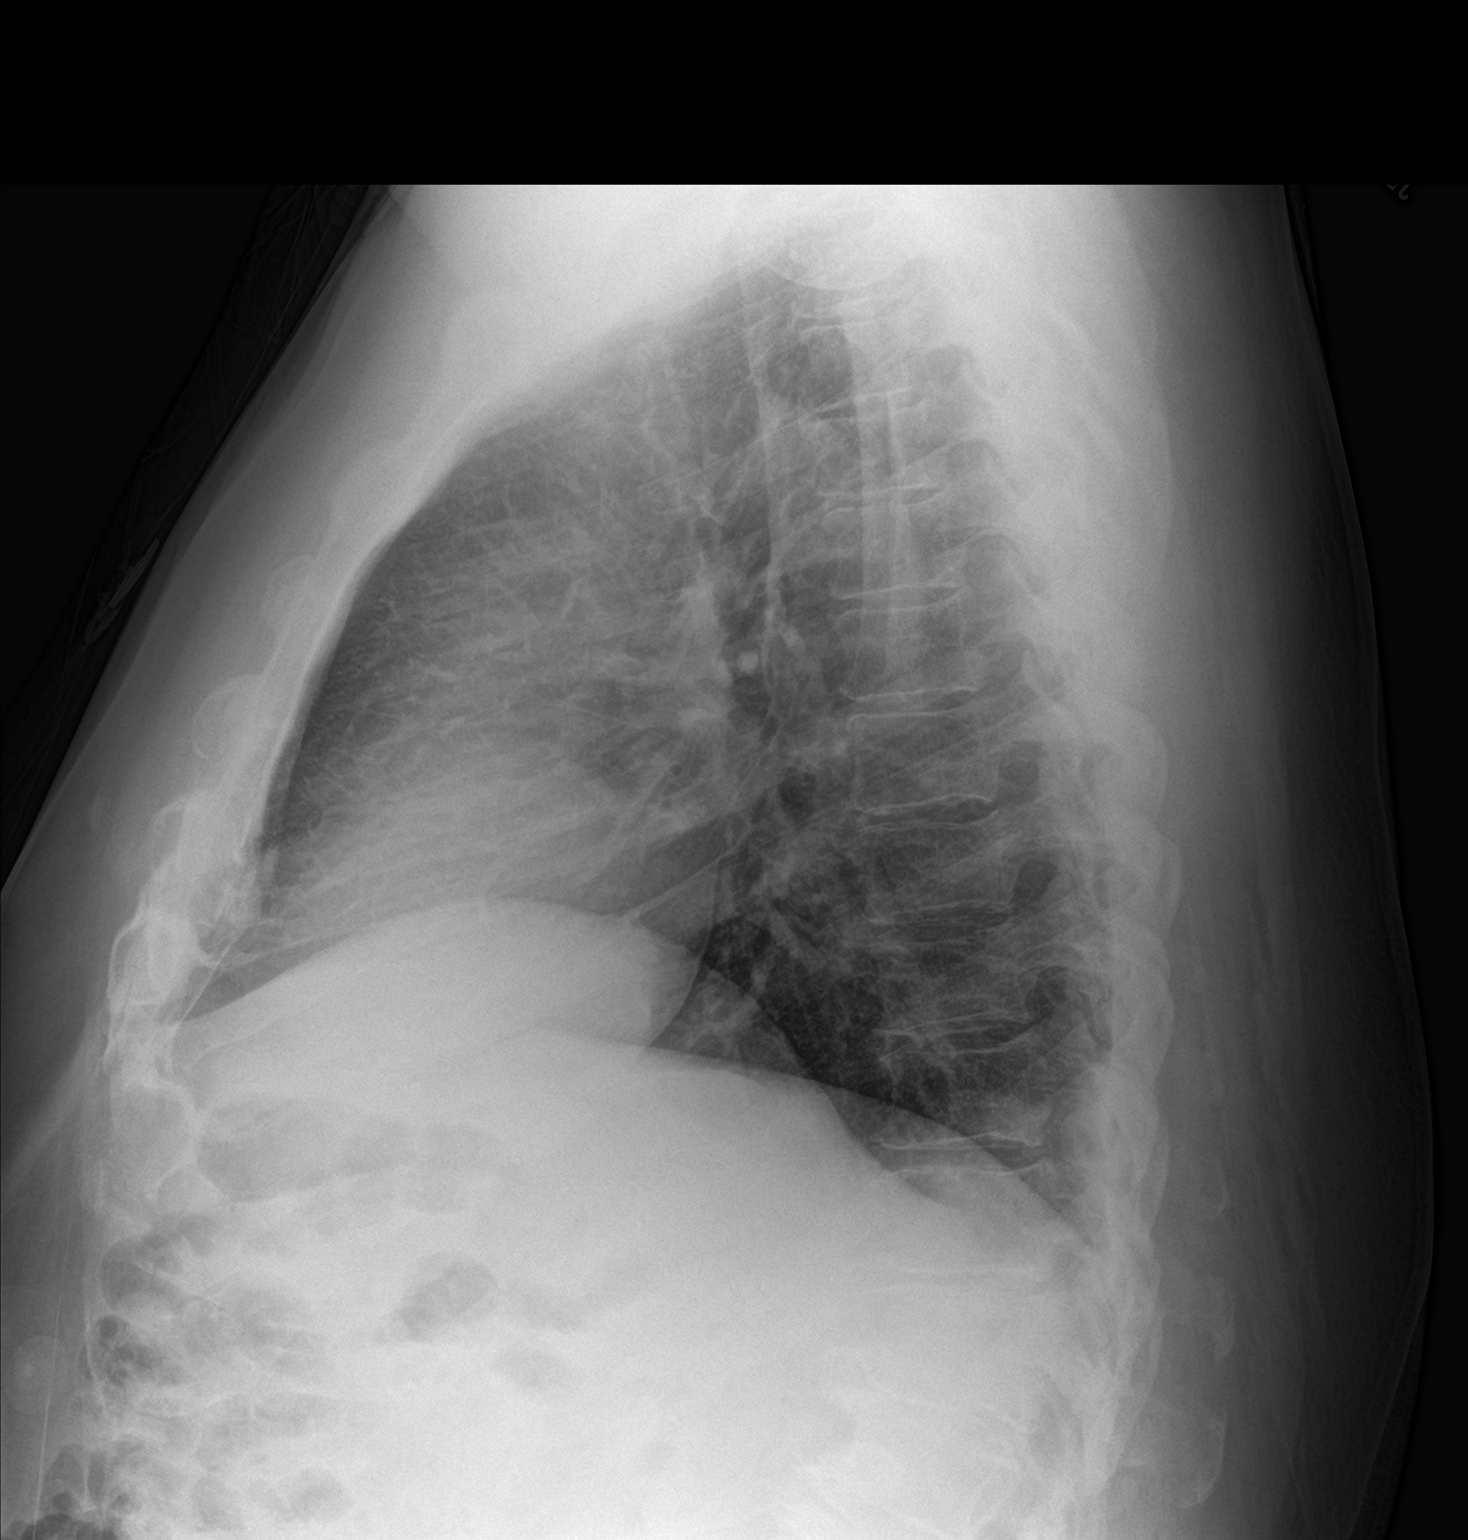

[2 of 2 positions shown; findings below may reference images not displayed]

FINDINGS: The lungs are well-aerated. Peribronchial thickening is noted. There
is no evidence of focal opacification, pleural effusion or
pneumothorax.

The heart is borderline normal in size. No acute osseous
abnormalities are seen.
IMPRESSION: Peribronchial thickening noted.  Lungs otherwise clear.

## 2017-10-14 DIAGNOSIS — M9902 Segmental and somatic dysfunction of thoracic region: Secondary | ICD-10-CM | POA: Diagnosis not present

## 2017-10-14 DIAGNOSIS — M4304 Spondylolysis, thoracic region: Secondary | ICD-10-CM | POA: Diagnosis not present

## 2017-10-14 DIAGNOSIS — M4306 Spondylolysis, lumbar region: Secondary | ICD-10-CM | POA: Diagnosis not present

## 2017-10-14 DIAGNOSIS — M9903 Segmental and somatic dysfunction of lumbar region: Secondary | ICD-10-CM | POA: Diagnosis not present

## 2017-10-14 DIAGNOSIS — M9901 Segmental and somatic dysfunction of cervical region: Secondary | ICD-10-CM | POA: Diagnosis not present

## 2017-10-14 DIAGNOSIS — M4302 Spondylolysis, cervical region: Secondary | ICD-10-CM | POA: Diagnosis not present

## 2017-10-17 DIAGNOSIS — H353221 Exudative age-related macular degeneration, left eye, with active choroidal neovascularization: Secondary | ICD-10-CM | POA: Diagnosis not present

## 2017-11-20 DIAGNOSIS — M9904 Segmental and somatic dysfunction of sacral region: Secondary | ICD-10-CM | POA: Diagnosis not present

## 2017-11-20 DIAGNOSIS — M5388 Other specified dorsopathies, sacral and sacrococcygeal region: Secondary | ICD-10-CM | POA: Diagnosis not present

## 2017-11-20 DIAGNOSIS — M9903 Segmental and somatic dysfunction of lumbar region: Secondary | ICD-10-CM | POA: Diagnosis not present

## 2017-11-20 DIAGNOSIS — M9901 Segmental and somatic dysfunction of cervical region: Secondary | ICD-10-CM | POA: Diagnosis not present

## 2017-11-20 DIAGNOSIS — M4303 Spondylolysis, cervicothoracic region: Secondary | ICD-10-CM | POA: Diagnosis not present

## 2017-11-20 DIAGNOSIS — M4306 Spondylolysis, lumbar region: Secondary | ICD-10-CM | POA: Diagnosis not present

## 2017-11-29 DIAGNOSIS — H353221 Exudative age-related macular degeneration, left eye, with active choroidal neovascularization: Secondary | ICD-10-CM | POA: Diagnosis not present

## 2018-01-01 DIAGNOSIS — M9901 Segmental and somatic dysfunction of cervical region: Secondary | ICD-10-CM | POA: Diagnosis not present

## 2018-01-01 DIAGNOSIS — M4304 Spondylolysis, thoracic region: Secondary | ICD-10-CM | POA: Diagnosis not present

## 2018-01-01 DIAGNOSIS — M9902 Segmental and somatic dysfunction of thoracic region: Secondary | ICD-10-CM | POA: Diagnosis not present

## 2018-01-01 DIAGNOSIS — M47896 Other spondylosis, lumbar region: Secondary | ICD-10-CM | POA: Diagnosis not present

## 2018-01-01 DIAGNOSIS — M9903 Segmental and somatic dysfunction of lumbar region: Secondary | ICD-10-CM | POA: Diagnosis not present

## 2018-01-01 DIAGNOSIS — M4302 Spondylolysis, cervical region: Secondary | ICD-10-CM | POA: Diagnosis not present

## 2018-01-17 DIAGNOSIS — H353221 Exudative age-related macular degeneration, left eye, with active choroidal neovascularization: Secondary | ICD-10-CM | POA: Diagnosis not present

## 2018-01-29 DIAGNOSIS — E7800 Pure hypercholesterolemia, unspecified: Secondary | ICD-10-CM | POA: Diagnosis not present

## 2018-01-29 DIAGNOSIS — Z125 Encounter for screening for malignant neoplasm of prostate: Secondary | ICD-10-CM | POA: Diagnosis not present

## 2018-01-29 DIAGNOSIS — R7303 Prediabetes: Secondary | ICD-10-CM | POA: Diagnosis not present

## 2018-01-29 DIAGNOSIS — I2699 Other pulmonary embolism without acute cor pulmonale: Secondary | ICD-10-CM | POA: Diagnosis not present

## 2018-01-29 DIAGNOSIS — Z89412 Acquired absence of left great toe: Secondary | ICD-10-CM | POA: Diagnosis not present

## 2018-01-29 DIAGNOSIS — Z1389 Encounter for screening for other disorder: Secondary | ICD-10-CM | POA: Diagnosis not present

## 2018-01-29 DIAGNOSIS — Z6841 Body Mass Index (BMI) 40.0 and over, adult: Secondary | ICD-10-CM | POA: Diagnosis not present

## 2018-01-29 DIAGNOSIS — Z0000 Encounter for general adult medical examination without abnormal findings: Secondary | ICD-10-CM | POA: Diagnosis not present

## 2018-02-05 DIAGNOSIS — M9901 Segmental and somatic dysfunction of cervical region: Secondary | ICD-10-CM | POA: Diagnosis not present

## 2018-02-05 DIAGNOSIS — M4304 Spondylolysis, thoracic region: Secondary | ICD-10-CM | POA: Diagnosis not present

## 2018-02-05 DIAGNOSIS — M47896 Other spondylosis, lumbar region: Secondary | ICD-10-CM | POA: Diagnosis not present

## 2018-02-05 DIAGNOSIS — M9903 Segmental and somatic dysfunction of lumbar region: Secondary | ICD-10-CM | POA: Diagnosis not present

## 2018-02-05 DIAGNOSIS — M9902 Segmental and somatic dysfunction of thoracic region: Secondary | ICD-10-CM | POA: Diagnosis not present

## 2018-02-05 DIAGNOSIS — M4302 Spondylolysis, cervical region: Secondary | ICD-10-CM | POA: Diagnosis not present

## 2018-03-04 DIAGNOSIS — J209 Acute bronchitis, unspecified: Secondary | ICD-10-CM | POA: Diagnosis not present

## 2018-03-11 DIAGNOSIS — H353221 Exudative age-related macular degeneration, left eye, with active choroidal neovascularization: Secondary | ICD-10-CM | POA: Diagnosis not present

## 2018-03-18 DIAGNOSIS — M4302 Spondylolysis, cervical region: Secondary | ICD-10-CM | POA: Diagnosis not present

## 2018-03-18 DIAGNOSIS — M9902 Segmental and somatic dysfunction of thoracic region: Secondary | ICD-10-CM | POA: Diagnosis not present

## 2018-03-18 DIAGNOSIS — M4306 Spondylolysis, lumbar region: Secondary | ICD-10-CM | POA: Diagnosis not present

## 2018-03-18 DIAGNOSIS — M9903 Segmental and somatic dysfunction of lumbar region: Secondary | ICD-10-CM | POA: Diagnosis not present

## 2018-03-18 DIAGNOSIS — M4304 Spondylolysis, thoracic region: Secondary | ICD-10-CM | POA: Diagnosis not present

## 2018-03-18 DIAGNOSIS — M9901 Segmental and somatic dysfunction of cervical region: Secondary | ICD-10-CM | POA: Diagnosis not present

## 2019-05-07 DIAGNOSIS — Z125 Encounter for screening for malignant neoplasm of prostate: Secondary | ICD-10-CM | POA: Diagnosis not present

## 2019-05-07 DIAGNOSIS — E7800 Pure hypercholesterolemia, unspecified: Secondary | ICD-10-CM | POA: Diagnosis not present

## 2019-05-07 DIAGNOSIS — Z6841 Body Mass Index (BMI) 40.0 and over, adult: Secondary | ICD-10-CM | POA: Diagnosis not present

## 2019-05-07 DIAGNOSIS — Z0000 Encounter for general adult medical examination without abnormal findings: Secondary | ICD-10-CM | POA: Diagnosis not present

## 2019-05-07 DIAGNOSIS — K635 Polyp of colon: Secondary | ICD-10-CM | POA: Diagnosis not present

## 2019-05-07 DIAGNOSIS — R7303 Prediabetes: Secondary | ICD-10-CM | POA: Diagnosis not present

## 2019-05-07 DIAGNOSIS — Z1389 Encounter for screening for other disorder: Secondary | ICD-10-CM | POA: Diagnosis not present

## 2019-05-08 ENCOUNTER — Encounter: Payer: Self-pay | Admitting: Internal Medicine

## 2019-06-08 ENCOUNTER — Encounter: Payer: Self-pay | Admitting: *Deleted

## 2019-06-22 ENCOUNTER — Encounter: Payer: Medicare Other | Admitting: Internal Medicine

## 2019-06-23 ENCOUNTER — Ambulatory Visit: Payer: PPO | Admitting: Internal Medicine

## 2019-06-23 ENCOUNTER — Encounter: Payer: Self-pay | Admitting: Internal Medicine

## 2019-06-23 VITALS — BP 156/80 | HR 88 | Ht 71.5 in | Wt 308.1 lb

## 2019-06-23 DIAGNOSIS — Z01818 Encounter for other preprocedural examination: Secondary | ICD-10-CM

## 2019-06-23 DIAGNOSIS — Z86010 Personal history of colonic polyps: Secondary | ICD-10-CM | POA: Diagnosis not present

## 2019-06-23 DIAGNOSIS — Z800 Family history of malignant neoplasm of digestive organs: Secondary | ICD-10-CM

## 2019-06-23 MED ORDER — SUTAB 1479-225-188 MG PO TABS: 1.0000 | ORAL_TABLET | ORAL | 0 refills | Status: DC

## 2019-06-23 NOTE — Progress Notes (Signed)
Patient ID: Aaron Lynch, male   DOB: 1950-12-08, 69 y.o.   MRN: [id number] HPI: Aaron Lynch is a 69 year old male with a past medical history of adenomatous colon polyps, colonic diverticulosis, history of remote DVT/PE now off of Eliquis therapy who is seen to discuss surveillance colonoscopy.  He is here alone today.  He is known to me from his last colonoscopy which was performed on 03/16/2013.  This revealed 5 polyps measuring 4 to 6 mm in size.  There was diverticulosis in the left colon.  Histology revealed 4 of these were adenomatous.  He reports that he has been doing well.  He will very rarely have constipation though he avoid straining.  On 2 occasions in the last year after having constipation and a large bowel movement he had acute anal pain with stool passing and bright red blood on the stool and with wiping.  This was only on 1 day each time.  Bowel movements otherwise have been regular occurring every morning and occasionally in the afternoon.  No abdominal pain.  No melena.  No heartburn, dysphagia or odynophagia.  Good appetite.  No nausea or vomiting or early satiety.  He eats a healthy diet and grows most of his food in his garden.  His family history is notable for mother with esophageal cancer.  He contributes that to her heavy tobacco use.  He does not personally use tobacco.   Past Medical History:  Diagnosis Date  . Arthritis    thumbs  . Cellulitis and abscess of buttock   . Deep vein thrombosis (DVT) ([place])   . Diverticulosis   . Pulmonary embolism ([place])   . Staph infection   . Tubular adenoma of colon     Past Surgical History:  Procedure Laterality Date  . AMPUTATION Left    partial amputation left great toe and part of foot from injury  . INCISION AND DRAINAGE PERIRECTAL ABSCESS N/A 07/28/2012   Procedure: IRRIGATION AND DEBRIDEMENT GLUTEAL ABCESS;  Surgeon: Merrie Roof, MD;  Location: [facility];  Service: General;  Laterality: N/A;    Outpatient  Medications Prior to Visit  Medication Sig Dispense Refill  . acetaminophen (TYLENOL) 325 MG tablet Take 1 tablet (325 mg total) by mouth every 4 (four) hours as needed for mild pain (or Fever >/= 101).    Marland Kitchen aspirin EC 81 MG tablet Take 81 mg by mouth daily.    . fluocinonide ointment (LIDEX) 2.42 % Apply 1 application topically 2 (two) times daily as needed (rash).   1  . Multiple Vitamin (MULTIVITAMIN WITH MINERALS) TABS tablet Take 1 tablet by mouth at bedtime.    Marland Kitchen apixaban (ELIQUIS) 5 MG TABS tablet Please take 2 tablets (10 mg) oral twice daily for 1 week,  then decrease (starting on 12/07/2014) to 1 tablet 5 mg oral twice a day thereafter. 74 tablet 1  . Cyanocobalamin (VITAMIN B-12 PO) Take 1 tablet by mouth at bedtime.     No facility-administered medications prior to visit.    Allergies  Allergen Reactions  . Sulfa Antibiotics Hives    Family History  Problem Relation Age of Onset  . Esophageal cancer Mother   . Diabetes Brother   . Colon cancer Neg Hx     Social History   Tobacco Use  . Smoking status: Never Smoker  . Smokeless tobacco: Former Systems developer    Types: Chew  . Tobacco comment: quit 25-30 years ago  Substance Use Topics  . Alcohol  use: Yes    Alcohol/week: 24.0 standard drinks    Types: 24 Cans of beer per week  . Drug use: No    ROS: As per history of present illness, otherwise negative  BP (!) 156/80 (BP Location: Left Arm, Patient Position: Sitting, Cuff Size: Normal)   Pulse 88   Ht 5' 11.5" (1.816 m) Comment: height measured without shoes  Wt (!) 308 lb 2 oz (139.8 kg)   BMI 42.38 kg/m  Constitutional: Well-developed and well-nourished. No distress. HEENT: Normocephalic and atraumatic. Conjunctivae are normal.  No scleral icterus. Neck: Neck supple. Trachea midline. Cardiovascular: Normal rate, regular rhythm and intact distal pulses. No M/R/G Pulmonary/chest: Effort normal and breath sounds normal. No wheezing, rales or rhonchi. Abdominal:  Soft, nontender, nondistended. Bowel sounds active throughout.  Neurological: Alert and oriented to person place and time. Psychiatric: Normal mood and affect. Behavior is normal.  ASSESSMENT/PLAN:  69 year old male with a past medical history of adenomatous colon polyps, colonic diverticulosis, history of remote DVT/PE now off of Eliquis therapy who is seen to discuss surveillance colonoscopy.  1.  Personal history of adenomatous colon polyps --surveillance colonoscopy is recommended at this time.  We discussed the risk, benefits and alternatives and he is agreeable and wishes to proceed.  We will use SuTab as a bowel preparation.  This procedure will be performed in the [facility] and he prefers July 2021 --Surveillance colonoscopy in the [facility]  2.  Family history of esophageal cancer --he does not have GERD symptoms or esophageal symptoms.  I recommended upper endoscopy as screening given his family history.  After this discussion he does not feel he wishes to proceed with this at this time.  He will think more about this and certainly notify me should he develop esophageal symptoms.      ES:PQZRAQT, Lorin Mercy, [place] [address] [place],  [place] [address]

## 2019-06-23 NOTE — Patient Instructions (Signed)
You have been scheduled for a colonoscopy. Please follow written instructions given to you at your visit today.  Please pick up your prep supplies at the pharmacy within the next 1-3 days. If you use inhalers (even only as needed), please bring them with you on the day of your procedure. Your physician has requested that you go to www.startemmi.com and enter the access code given to you at your visit today. This web site gives a general overview about your procedure. However, you should still follow specific instructions given to you by our office regarding your preparation for the procedure. _______________________________________ It has been recommended to you by your physician that you have a(n) endoscopy completed at the time of your colonoscopy. Per your request, we did not schedule that procedure(s) today. Please contact our office at [phone] should you decide to have the procedure completed.  _______________________________________ If you are age 60 or older, your body mass index should be between 23-30. Your Body mass index is 42.38 kg/m. If this is out of the aforementioned range listed, please consider follow up with your Primary Care Provider.  If you are age 90 or younger, your body mass index should be between 19-25. Your Body mass index is 42.38 kg/m. If this is out of the aformentioned range listed, please consider follow up with your Primary Care Provider.  ________________________________________ Due to recent changes in healthcare laws, you may see the results of your imaging and laboratory studies on MyChart before your provider has had a chance to review them.  We understand that in some cases there may be results that are confusing or concerning to you. Not all laboratory results come back in the same time frame and the provider may be waiting for multiple results in order to interpret others.  Please give [place] 48 hours in order for your provider to thoroughly review all the  results before contacting the office for clarification of your results.

## 2019-07-17 ENCOUNTER — Other Ambulatory Visit: Payer: Self-pay | Admitting: Internal Medicine

## 2019-07-17 ENCOUNTER — Ambulatory Visit (INDEPENDENT_AMBULATORY_CARE_PROVIDER_SITE_OTHER): Payer: PPO

## 2019-07-17 DIAGNOSIS — Z1159 Encounter for screening for other viral diseases: Secondary | ICD-10-CM | POA: Diagnosis not present

## 2019-07-18 LAB — SARS CORONAVIRUS 2 (TAT 6-24 HRS): SARS Coronavirus 2: NEGATIVE

## 2019-07-23 ENCOUNTER — Other Ambulatory Visit: Payer: Self-pay

## 2019-07-23 ENCOUNTER — Encounter: Payer: Self-pay | Admitting: Internal Medicine

## 2019-07-23 ENCOUNTER — Ambulatory Visit (AMBULATORY_SURGERY_CENTER): Payer: PPO | Admitting: Internal Medicine

## 2019-07-23 VITALS — BP 139/92 | HR 89 | Temp 98.9°F | Resp 25 | Ht 71.0 in | Wt 308.0 lb

## 2019-07-23 DIAGNOSIS — Z1211 Encounter for screening for malignant neoplasm of colon: Secondary | ICD-10-CM | POA: Diagnosis not present

## 2019-07-23 DIAGNOSIS — Z86010 Personal history of colonic polyps: Secondary | ICD-10-CM

## 2019-07-23 DIAGNOSIS — M1990 Unspecified osteoarthritis, unspecified site: Secondary | ICD-10-CM | POA: Diagnosis not present

## 2019-07-23 MED ORDER — SODIUM CHLORIDE 0.9 % IV SOLN: 500.0000 mL | Freq: Once | INTRAVENOUS | Status: DC

## 2019-07-23 NOTE — Op Note (Signed)
[facility] Patient Name: Keionte Swicegood Procedure Date: 07/23/2019 10:51 AM MRN: [id number] Endoscopist: Jerene Bears , MD Age: 69 Referring MD:  Date of Birth: 1950/02/20 Gender: Male Account #: [id number] Procedure:                Colonoscopy Indications:              High risk colon cancer surveillance: Personal                            history of multiple (3 or more) adenomas, Last                            colonoscopy: March 2015 Medicines:                Monitored Anesthesia Care Procedure:                Pre-Anesthesia Assessment:                           - Prior to the procedure, a History and Physical                            was performed, and patient medications and                            allergies were reviewed. The patient's tolerance of                            previous anesthesia was also reviewed. The risks                            and benefits of the procedure and the sedation                            options and risks were discussed with the patient.                            All questions were answered, and informed consent                            was obtained. Prior Anticoagulants: The patient has                            taken no previous anticoagulant or antiplatelet                            agents. ASA Grade Assessment: III - A patient with                            severe systemic disease. After reviewing the risks                            and benefits, the patient was deemed in  satisfactory condition to undergo the procedure.                           After obtaining informed consent, the colonoscope                            was passed under direct vision. Throughout the                            procedure, the patient's blood pressure, pulse, and                            oxygen saturations were monitored continuously. The                            Colonoscope was introduced through the anus  and                            advanced to the cecum, identified by appendiceal                            orifice and ileocecal valve. The colonoscopy was                            performed without difficulty. The patient tolerated                            the procedure well. The quality of the bowel                            preparation was good. The ileocecal valve,                            appendiceal orifice, and rectum were photographed. Scope In: 11:03:22 AM Scope Out: 11:13:22 AM Scope Withdrawal Time: 0 hours 8 minutes 47 seconds  Total Procedure Duration: 0 hours 10 minutes 0 seconds  Findings:                 The digital rectal exam was normal.                           Multiple medium-mouthed diverticula were found in                            the sigmoid colon and descending colon.                           Internal hemorrhoids were found during                            retroflexion. The hemorrhoids were small.                           The exam was otherwise without abnormality. Complications:            No  immediate complications. Estimated Blood Loss:     Estimated blood loss: none. Impression:               - Diverticulosis in the sigmoid colon and in the                            descending colon.                           - Small internal hemorrhoids.                           - The examination was otherwise normal.                           - No specimens collected. Recommendation:           - Patient has a contact number available for                            emergencies. The signs and symptoms of potential                            delayed complications were discussed with the                            patient. Return to normal activities tomorrow.                            Written discharge instructions were provided to the                            patient.                           - Resume previous diet.                           - Continue  present medications.                           - Repeat colonoscopy in 10 years for surveillance. Jerene Bears, MD 07/23/2019 11:16:14 AM This report has been signed electronically.

## 2019-07-23 NOTE — Progress Notes (Signed)
Temp check by:CW Vital check by:CW   The medical and surgical history was reviewed and verified with the patient.

## 2019-07-23 NOTE — Patient Instructions (Signed)
   HANDOUTS ON DIVERTICULOSIS & HEMORRHOIDS GIVEN TO YOU TODAY  YOU HAD AN ENDOSCOPIC PROCEDURE TODAY AT THE [facility]:   Refer to the procedure report that was given to you for any specific questions about what was found during the examination.  If the procedure report does not answer your questions, please call your gastroenterologist to clarify.  If you requested that your care partner not be given the details of your procedure findings, then the procedure report has been included in a sealed envelope for you to review at your convenience later.  YOU SHOULD EXPECT: Some feelings of bloating in the abdomen. Passage of more gas than usual.  Walking can help get rid of the air that was put into your GI tract during the procedure and reduce the bloating. If you had a lower endoscopy (such as a colonoscopy or flexible sigmoidoscopy) you may notice spotting of blood in your stool or on the toilet paper. If you underwent a bowel prep for your procedure, you may not have a normal bowel movement for a few days.  Please Note:  You might notice some irritation and congestion in your nose or some drainage.  This is from the oxygen used during your procedure.  There is no need for concern and it should clear up in a day or so.  SYMPTOMS TO REPORT IMMEDIATELY:  Following lower endoscopy (colonoscopy or flexible sigmoidoscopy):  Excessive amounts of blood in the stool  Significant tenderness or worsening of abdominal pains  Swelling of the abdomen that is new, acute  Fever of 100F or higher   For urgent or emergent issues, a gastroenterologist can be reached at any hour by calling [phone]. Do not use MyChart messaging for urgent concerns.    DIET:  We do recommend a small meal at first, but then you may proceed to your regular diet.  Drink plenty of fluids but you should avoid alcoholic beverages for 24 hours.  ACTIVITY:  You should plan to take it easy for the rest of today and  you should NOT DRIVE or use heavy machinery until tomorrow (because of the sedation medicines used during the test).    FOLLOW UP: Our staff will call the number listed on your records 48-72 hours following your procedure to check on you and address any questions or concerns that you may have regarding the information given to you following your procedure. If we do not reach you, we will leave a message.  We will attempt to reach you two times.  During this call, we will ask if you have developed any symptoms of COVID 19. If you develop any symptoms (ie: fever, flu-like symptoms, shortness of breath, cough etc.) before then, please call [phone].  If you test positive for Covid 19 in the 2 weeks post procedure, please call and report this information to us.    If any biopsies were taken you will be contacted by phone or by letter within the next 1-3 weeks.  Please call us at [phone] if you have not heard about the biopsies in 3 weeks.    SIGNATURES/CONFIDENTIALITY: You and/or your care partner have signed paperwork which will be entered into your electronic medical record.  These signatures attest to the fact that that the information above on your After Visit Summary has been reviewed and is understood.  Full responsibility of the confidentiality of this discharge information lies with you and/or your care-partner.  

## 2019-07-23 NOTE — Progress Notes (Signed)
Report to PACU, RN, vss, BBS= Clear.  

## 2019-07-27 ENCOUNTER — Telehealth: Payer: Self-pay | Admitting: *Deleted

## 2019-07-27 NOTE — Telephone Encounter (Signed)
  Follow up Call-  Call back number 07/23/2019  Post procedure Call Back phone  # [phone]  Permission to leave phone message Yes  Some recent data might be hidden     Patient questions:  Do you have a fever, pain , or abdominal swelling? No. Pain Score  0 *  Have you tolerated food without any problems? Yes.    Have you been able to return to your normal activities? Yes.    Do you have any questions about your discharge instructions: Diet   No. Medications  No. Follow up visit  No.  Do you have questions or concerns about your Care? No.  Actions: * If pain score is 4 or above: No action needed, pain <4.  1. Have you developed a fever since your procedure? no  2.   Have you had an respiratory symptoms (SOB or cough) since your procedure? no  3.   Have you tested positive for COVID 19 since your procedure no  4.   Have you had any family members/close contacts diagnosed with the COVID 19 since your procedure?  no   If yes to any of these questions please route to Joylene John, RN and Erenest Rasher, RN

## 2019-12-09 DIAGNOSIS — M9903 Segmental and somatic dysfunction of lumbar region: Secondary | ICD-10-CM | POA: Diagnosis not present

## 2019-12-09 DIAGNOSIS — M4726 Other spondylosis with radiculopathy, lumbar region: Secondary | ICD-10-CM | POA: Diagnosis not present

## 2019-12-09 DIAGNOSIS — M9902 Segmental and somatic dysfunction of thoracic region: Secondary | ICD-10-CM | POA: Diagnosis not present

## 2019-12-09 DIAGNOSIS — M9904 Segmental and somatic dysfunction of sacral region: Secondary | ICD-10-CM | POA: Diagnosis not present

## 2019-12-09 DIAGNOSIS — M4725 Other spondylosis with radiculopathy, thoracolumbar region: Secondary | ICD-10-CM | POA: Diagnosis not present

## 2019-12-09 DIAGNOSIS — M5388 Other specified dorsopathies, sacral and sacrococcygeal region: Secondary | ICD-10-CM | POA: Diagnosis not present

## 2019-12-14 DIAGNOSIS — M9904 Segmental and somatic dysfunction of sacral region: Secondary | ICD-10-CM | POA: Diagnosis not present

## 2019-12-14 DIAGNOSIS — M4306 Spondylolysis, lumbar region: Secondary | ICD-10-CM | POA: Diagnosis not present

## 2019-12-14 DIAGNOSIS — M9903 Segmental and somatic dysfunction of lumbar region: Secondary | ICD-10-CM | POA: Diagnosis not present

## 2019-12-14 DIAGNOSIS — M9902 Segmental and somatic dysfunction of thoracic region: Secondary | ICD-10-CM | POA: Diagnosis not present

## 2019-12-14 DIAGNOSIS — M5388 Other specified dorsopathies, sacral and sacrococcygeal region: Secondary | ICD-10-CM | POA: Diagnosis not present

## 2019-12-14 DIAGNOSIS — M4304 Spondylolysis, thoracic region: Secondary | ICD-10-CM | POA: Diagnosis not present

## 2019-12-18 DIAGNOSIS — M4306 Spondylolysis, lumbar region: Secondary | ICD-10-CM | POA: Diagnosis not present

## 2019-12-18 DIAGNOSIS — M9903 Segmental and somatic dysfunction of lumbar region: Secondary | ICD-10-CM | POA: Diagnosis not present

## 2019-12-18 DIAGNOSIS — M9902 Segmental and somatic dysfunction of thoracic region: Secondary | ICD-10-CM | POA: Diagnosis not present

## 2019-12-18 DIAGNOSIS — M4304 Spondylolysis, thoracic region: Secondary | ICD-10-CM | POA: Diagnosis not present

## 2019-12-18 DIAGNOSIS — M5388 Other specified dorsopathies, sacral and sacrococcygeal region: Secondary | ICD-10-CM | POA: Diagnosis not present

## 2019-12-18 DIAGNOSIS — M9904 Segmental and somatic dysfunction of sacral region: Secondary | ICD-10-CM | POA: Diagnosis not present

## 2019-12-24 DIAGNOSIS — M4302 Spondylolysis, cervical region: Secondary | ICD-10-CM | POA: Diagnosis not present

## 2019-12-24 DIAGNOSIS — M9901 Segmental and somatic dysfunction of cervical region: Secondary | ICD-10-CM | POA: Diagnosis not present

## 2019-12-24 DIAGNOSIS — M4306 Spondylolysis, lumbar region: Secondary | ICD-10-CM | POA: Diagnosis not present

## 2019-12-24 DIAGNOSIS — M9903 Segmental and somatic dysfunction of lumbar region: Secondary | ICD-10-CM | POA: Diagnosis not present

## 2019-12-24 DIAGNOSIS — M9902 Segmental and somatic dysfunction of thoracic region: Secondary | ICD-10-CM | POA: Diagnosis not present

## 2019-12-24 DIAGNOSIS — M4304 Spondylolysis, thoracic region: Secondary | ICD-10-CM | POA: Diagnosis not present

## 2019-12-30 DIAGNOSIS — M9901 Segmental and somatic dysfunction of cervical region: Secondary | ICD-10-CM | POA: Diagnosis not present

## 2019-12-30 DIAGNOSIS — M4306 Spondylolysis, lumbar region: Secondary | ICD-10-CM | POA: Diagnosis not present

## 2019-12-30 DIAGNOSIS — M9902 Segmental and somatic dysfunction of thoracic region: Secondary | ICD-10-CM | POA: Diagnosis not present

## 2019-12-30 DIAGNOSIS — M4304 Spondylolysis, thoracic region: Secondary | ICD-10-CM | POA: Diagnosis not present

## 2019-12-30 DIAGNOSIS — M4302 Spondylolysis, cervical region: Secondary | ICD-10-CM | POA: Diagnosis not present

## 2019-12-30 DIAGNOSIS — M9903 Segmental and somatic dysfunction of lumbar region: Secondary | ICD-10-CM | POA: Diagnosis not present

## 2020-01-06 DIAGNOSIS — M9904 Segmental and somatic dysfunction of sacral region: Secondary | ICD-10-CM | POA: Diagnosis not present

## 2020-01-06 DIAGNOSIS — M4304 Spondylolysis, thoracic region: Secondary | ICD-10-CM | POA: Diagnosis not present

## 2020-01-06 DIAGNOSIS — M9903 Segmental and somatic dysfunction of lumbar region: Secondary | ICD-10-CM | POA: Diagnosis not present

## 2020-01-06 DIAGNOSIS — M4306 Spondylolysis, lumbar region: Secondary | ICD-10-CM | POA: Diagnosis not present

## 2020-01-06 DIAGNOSIS — M9902 Segmental and somatic dysfunction of thoracic region: Secondary | ICD-10-CM | POA: Diagnosis not present

## 2020-01-06 DIAGNOSIS — M5388 Other specified dorsopathies, sacral and sacrococcygeal region: Secondary | ICD-10-CM | POA: Diagnosis not present

## 2020-02-19 DIAGNOSIS — M4727 Other spondylosis with radiculopathy, lumbosacral region: Secondary | ICD-10-CM | POA: Diagnosis not present

## 2020-02-19 DIAGNOSIS — M47812 Spondylosis without myelopathy or radiculopathy, cervical region: Secondary | ICD-10-CM | POA: Diagnosis not present

## 2020-02-19 DIAGNOSIS — M9903 Segmental and somatic dysfunction of lumbar region: Secondary | ICD-10-CM | POA: Diagnosis not present

## 2020-02-19 DIAGNOSIS — M9904 Segmental and somatic dysfunction of sacral region: Secondary | ICD-10-CM | POA: Diagnosis not present

## 2020-02-19 DIAGNOSIS — M9905 Segmental and somatic dysfunction of pelvic region: Secondary | ICD-10-CM | POA: Diagnosis not present

## 2020-02-19 DIAGNOSIS — M4306 Spondylolysis, lumbar region: Secondary | ICD-10-CM | POA: Diagnosis not present

## 2020-02-19 DIAGNOSIS — M47892 Other spondylosis, cervical region: Secondary | ICD-10-CM | POA: Diagnosis not present

## 2020-02-19 DIAGNOSIS — M5388 Other specified dorsopathies, sacral and sacrococcygeal region: Secondary | ICD-10-CM | POA: Diagnosis not present

## 2020-02-19 DIAGNOSIS — M9901 Segmental and somatic dysfunction of cervical region: Secondary | ICD-10-CM | POA: Diagnosis not present

## 2020-03-29 DIAGNOSIS — M5388 Other specified dorsopathies, sacral and sacrococcygeal region: Secondary | ICD-10-CM | POA: Diagnosis not present

## 2020-03-29 DIAGNOSIS — M4726 Other spondylosis with radiculopathy, lumbar region: Secondary | ICD-10-CM | POA: Diagnosis not present

## 2020-03-29 DIAGNOSIS — M9903 Segmental and somatic dysfunction of lumbar region: Secondary | ICD-10-CM | POA: Diagnosis not present

## 2020-03-29 DIAGNOSIS — M9901 Segmental and somatic dysfunction of cervical region: Secondary | ICD-10-CM | POA: Diagnosis not present

## 2020-03-29 DIAGNOSIS — M9904 Segmental and somatic dysfunction of sacral region: Secondary | ICD-10-CM | POA: Diagnosis not present

## 2020-03-29 DIAGNOSIS — M47812 Spondylosis without myelopathy or radiculopathy, cervical region: Secondary | ICD-10-CM | POA: Diagnosis not present

## 2020-04-28 DIAGNOSIS — M47812 Spondylosis without myelopathy or radiculopathy, cervical region: Secondary | ICD-10-CM | POA: Diagnosis not present

## 2020-04-28 DIAGNOSIS — M9903 Segmental and somatic dysfunction of lumbar region: Secondary | ICD-10-CM | POA: Diagnosis not present

## 2020-04-28 DIAGNOSIS — M9904 Segmental and somatic dysfunction of sacral region: Secondary | ICD-10-CM | POA: Diagnosis not present

## 2020-04-28 DIAGNOSIS — M5388 Other specified dorsopathies, sacral and sacrococcygeal region: Secondary | ICD-10-CM | POA: Diagnosis not present

## 2020-04-28 DIAGNOSIS — M4726 Other spondylosis with radiculopathy, lumbar region: Secondary | ICD-10-CM | POA: Diagnosis not present

## 2020-04-28 DIAGNOSIS — M9901 Segmental and somatic dysfunction of cervical region: Secondary | ICD-10-CM | POA: Diagnosis not present

## 2020-05-05 DIAGNOSIS — L0889 Other specified local infections of the skin and subcutaneous tissue: Secondary | ICD-10-CM | POA: Diagnosis not present

## 2020-05-05 DIAGNOSIS — I8310 Varicose veins of unspecified lower extremity with inflammation: Secondary | ICD-10-CM | POA: Diagnosis not present

## 2020-05-06 DIAGNOSIS — R7303 Prediabetes: Secondary | ICD-10-CM | POA: Diagnosis not present

## 2020-05-06 DIAGNOSIS — E7800 Pure hypercholesterolemia, unspecified: Secondary | ICD-10-CM | POA: Diagnosis not present

## 2020-05-06 DIAGNOSIS — Z6841 Body Mass Index (BMI) 40.0 and over, adult: Secondary | ICD-10-CM | POA: Diagnosis not present

## 2020-05-06 DIAGNOSIS — Z0000 Encounter for general adult medical examination without abnormal findings: Secondary | ICD-10-CM | POA: Diagnosis not present

## 2020-05-06 DIAGNOSIS — Z1389 Encounter for screening for other disorder: Secondary | ICD-10-CM | POA: Diagnosis not present

## 2020-05-06 DIAGNOSIS — Z125 Encounter for screening for malignant neoplasm of prostate: Secondary | ICD-10-CM | POA: Diagnosis not present

## 2020-05-26 DIAGNOSIS — T148XXA Other injury of unspecified body region, initial encounter: Secondary | ICD-10-CM | POA: Diagnosis not present

## 2020-05-26 DIAGNOSIS — L0889 Other specified local infections of the skin and subcutaneous tissue: Secondary | ICD-10-CM | POA: Diagnosis not present

## 2020-05-26 DIAGNOSIS — I8310 Varicose veins of unspecified lower extremity with inflammation: Secondary | ICD-10-CM | POA: Diagnosis not present

## 2020-06-01 DIAGNOSIS — M4724 Other spondylosis with radiculopathy, thoracic region: Secondary | ICD-10-CM | POA: Diagnosis not present

## 2020-06-01 DIAGNOSIS — M47893 Other spondylosis, cervicothoracic region: Secondary | ICD-10-CM | POA: Diagnosis not present

## 2020-06-01 DIAGNOSIS — M9902 Segmental and somatic dysfunction of thoracic region: Secondary | ICD-10-CM | POA: Diagnosis not present

## 2020-06-01 DIAGNOSIS — M9901 Segmental and somatic dysfunction of cervical region: Secondary | ICD-10-CM | POA: Diagnosis not present

## 2020-06-01 DIAGNOSIS — M9903 Segmental and somatic dysfunction of lumbar region: Secondary | ICD-10-CM | POA: Diagnosis not present

## 2020-06-01 DIAGNOSIS — M4725 Other spondylosis with radiculopathy, thoracolumbar region: Secondary | ICD-10-CM | POA: Diagnosis not present

## 2020-06-14 DIAGNOSIS — S91302D Unspecified open wound, left foot, subsequent encounter: Secondary | ICD-10-CM | POA: Diagnosis not present

## 2020-06-16 DIAGNOSIS — M4724 Other spondylosis with radiculopathy, thoracic region: Secondary | ICD-10-CM | POA: Diagnosis not present

## 2020-06-16 DIAGNOSIS — M9903 Segmental and somatic dysfunction of lumbar region: Secondary | ICD-10-CM | POA: Diagnosis not present

## 2020-06-16 DIAGNOSIS — M4725 Other spondylosis with radiculopathy, thoracolumbar region: Secondary | ICD-10-CM | POA: Diagnosis not present

## 2020-06-16 DIAGNOSIS — M9902 Segmental and somatic dysfunction of thoracic region: Secondary | ICD-10-CM | POA: Diagnosis not present

## 2020-06-16 DIAGNOSIS — M9901 Segmental and somatic dysfunction of cervical region: Secondary | ICD-10-CM | POA: Diagnosis not present

## 2020-06-16 DIAGNOSIS — M47893 Other spondylosis, cervicothoracic region: Secondary | ICD-10-CM | POA: Diagnosis not present

## 2020-07-15 DIAGNOSIS — M4723 Other spondylosis with radiculopathy, cervicothoracic region: Secondary | ICD-10-CM | POA: Diagnosis not present

## 2020-07-15 DIAGNOSIS — M47895 Other spondylosis, thoracolumbar region: Secondary | ICD-10-CM | POA: Diagnosis not present

## 2020-07-15 DIAGNOSIS — M9903 Segmental and somatic dysfunction of lumbar region: Secondary | ICD-10-CM | POA: Diagnosis not present

## 2020-07-15 DIAGNOSIS — M9902 Segmental and somatic dysfunction of thoracic region: Secondary | ICD-10-CM | POA: Diagnosis not present

## 2020-07-15 DIAGNOSIS — M9901 Segmental and somatic dysfunction of cervical region: Secondary | ICD-10-CM | POA: Diagnosis not present

## 2020-07-15 DIAGNOSIS — M47817 Spondylosis without myelopathy or radiculopathy, lumbosacral region: Secondary | ICD-10-CM | POA: Diagnosis not present

## 2020-08-08 DIAGNOSIS — L0889 Other specified local infections of the skin and subcutaneous tissue: Secondary | ICD-10-CM | POA: Diagnosis not present

## 2020-08-08 DIAGNOSIS — S91302D Unspecified open wound, left foot, subsequent encounter: Secondary | ICD-10-CM | POA: Diagnosis not present

## 2020-08-15 DIAGNOSIS — M47893 Other spondylosis, cervicothoracic region: Secondary | ICD-10-CM | POA: Diagnosis not present

## 2020-08-15 DIAGNOSIS — M9903 Segmental and somatic dysfunction of lumbar region: Secondary | ICD-10-CM | POA: Diagnosis not present

## 2020-08-15 DIAGNOSIS — M4727 Other spondylosis with radiculopathy, lumbosacral region: Secondary | ICD-10-CM | POA: Diagnosis not present

## 2020-08-15 DIAGNOSIS — M9901 Segmental and somatic dysfunction of cervical region: Secondary | ICD-10-CM | POA: Diagnosis not present

## 2020-08-15 DIAGNOSIS — M9902 Segmental and somatic dysfunction of thoracic region: Secondary | ICD-10-CM | POA: Diagnosis not present

## 2020-08-15 DIAGNOSIS — M47895 Other spondylosis, thoracolumbar region: Secondary | ICD-10-CM | POA: Diagnosis not present

## 2020-08-26 DIAGNOSIS — B353 Tinea pedis: Secondary | ICD-10-CM | POA: Diagnosis not present

## 2020-08-26 DIAGNOSIS — S91302D Unspecified open wound, left foot, subsequent encounter: Secondary | ICD-10-CM | POA: Diagnosis not present

## 2020-09-28 DIAGNOSIS — M4723 Other spondylosis with radiculopathy, cervicothoracic region: Secondary | ICD-10-CM | POA: Diagnosis not present

## 2020-09-28 DIAGNOSIS — M47895 Other spondylosis, thoracolumbar region: Secondary | ICD-10-CM | POA: Diagnosis not present

## 2020-09-28 DIAGNOSIS — M4727 Other spondylosis with radiculopathy, lumbosacral region: Secondary | ICD-10-CM | POA: Diagnosis not present

## 2020-09-28 DIAGNOSIS — M9901 Segmental and somatic dysfunction of cervical region: Secondary | ICD-10-CM | POA: Diagnosis not present

## 2020-09-28 DIAGNOSIS — M9902 Segmental and somatic dysfunction of thoracic region: Secondary | ICD-10-CM | POA: Diagnosis not present

## 2020-09-28 DIAGNOSIS — M9903 Segmental and somatic dysfunction of lumbar region: Secondary | ICD-10-CM | POA: Diagnosis not present

## 2024-01-02 ENCOUNTER — Ambulatory Visit (HOSPITAL_COMMUNITY): Admission: RE | Admit: 2024-01-02 | Source: Ambulatory Visit

## 2024-01-02 ENCOUNTER — Other Ambulatory Visit (HOSPITAL_COMMUNITY): Payer: Self-pay | Admitting: Internal Medicine

## 2024-01-02 DIAGNOSIS — R609 Edema, unspecified: Secondary | ICD-10-CM
